# Patient Record
Sex: Male | Born: 1955
Health system: Southern US, Community
[De-identification: ages and names within clinical notes are randomized; demographics above are authoritative.]

## PROBLEM LIST (undated history)

## (undated) DIAGNOSIS — I498 Other specified cardiac arrhythmias: Secondary | ICD-10-CM

## (undated) DIAGNOSIS — I480 Paroxysmal atrial fibrillation: Secondary | ICD-10-CM

## (undated) DIAGNOSIS — I2510 Atherosclerotic heart disease of native coronary artery without angina pectoris: Secondary | ICD-10-CM

## (undated) DIAGNOSIS — E785 Hyperlipidemia, unspecified: Secondary | ICD-10-CM

## (undated) HISTORY — PX: TONSILLECTOMY: SUR1361

## (undated) HISTORY — DX: Atherosclerotic heart disease of native coronary artery without angina pectoris: I25.10

## (undated) HISTORY — DX: Hyperlipidemia, unspecified: E78.5

## (undated) HISTORY — DX: Other specified cardiac arrhythmias: I49.8

## (undated) HISTORY — DX: Paroxysmal atrial fibrillation: I48.0

## (undated) HISTORY — PX: HERNIA REPAIR: SHX51

---

## 2004-06-10 ENCOUNTER — Ambulatory Visit: Payer: Self-pay | Admitting: Cardiology

## 2005-06-25 ENCOUNTER — Ambulatory Visit (HOSPITAL_BASED_OUTPATIENT_CLINIC_OR_DEPARTMENT_OTHER): Admission: RE | Admit: 2005-06-25 | Discharge: 2005-06-25 | Payer: Self-pay | Admitting: Orthopedic Surgery

## 2005-06-25 ENCOUNTER — Ambulatory Visit (HOSPITAL_COMMUNITY): Admission: RE | Admit: 2005-06-25 | Discharge: 2005-06-25 | Payer: Self-pay | Admitting: Orthopedic Surgery

## 2006-03-08 ENCOUNTER — Ambulatory Visit: Payer: Self-pay | Admitting: Cardiology

## 2007-05-12 ENCOUNTER — Ambulatory Visit: Payer: Self-pay | Admitting: Cardiology

## 2007-06-23 ENCOUNTER — Ambulatory Visit: Payer: Self-pay | Admitting: Cardiology

## 2007-06-23 LAB — CONVERTED CEMR LAB
ALT: 27 units/L (ref 0–53)
Bilirubin, Direct: 0.1 mg/dL (ref 0.0–0.3)
Cholesterol: 131 mg/dL (ref 0–200)
HDL: 36.5 mg/dL — ABNORMAL LOW (ref >39.0)
LDL Cholesterol: 86 mg/dL (ref 0–99)
Total CHOL/HDL Ratio: 3.6
Total Protein: 6.6 g/dL (ref 6.0–8.3)
Triglycerides: 45 mg/dL (ref 0–149)
VLDL: 9 mg/dL (ref 0–40)

## 2007-07-28 ENCOUNTER — Encounter: Payer: Self-pay | Admitting: Cardiology

## 2007-07-28 ENCOUNTER — Ambulatory Visit: Payer: Self-pay

## 2007-11-02 ENCOUNTER — Ambulatory Visit: Payer: Self-pay | Admitting: Cardiology

## 2007-11-10 ENCOUNTER — Ambulatory Visit: Payer: Self-pay | Admitting: Cardiology

## 2007-11-10 LAB — CONVERTED CEMR LAB
BUN: 13 mg/dL
Basophils Absolute: 0 10*3/uL
Basophils Relative: 0.1 %
CO2: 32 meq/L
Calcium: 8.9 mg/dL
Chloride: 107 meq/L
Creatinine, Ser: 0.9 mg/dL
Eosinophils Absolute: 0.1 10*3/uL
Eosinophils Relative: 1.9 %
GFR calc Af Amer: 114 mL/min
GFR calc non Af Amer: 95 mL/min
Glucose, Bld: 96 mg/dL
HCT: 40 %
Hemoglobin: 13.6 g/dL
INR: 1
Lymphocytes Relative: 27.1 %
MCHC: 34.1 g/dL
MCV: 87.7 fL
Monocytes Absolute: 0.4 10*3/uL
Monocytes Relative: 5.7 %
Neutro Abs: 4.2 10*3/uL
Neutrophils Relative %: 65.2 %
Platelets: 184 10*3/uL
Potassium: 4.1 meq/L
Prothrombin Time: 12.1 s
RBC: 4.56 M/uL
RDW: 12.5 %
Sodium: 142 meq/L
WBC: 6.5 10*3/uL
aPTT: 30.7 s — ABNORMAL HIGH

## 2007-11-16 ENCOUNTER — Inpatient Hospital Stay (HOSPITAL_BASED_OUTPATIENT_CLINIC_OR_DEPARTMENT_OTHER): Admission: RE | Admit: 2007-11-16 | Discharge: 2007-11-17 | Payer: Self-pay | Admitting: Cardiology

## 2007-11-16 ENCOUNTER — Ambulatory Visit: Payer: Self-pay | Admitting: Cardiology

## 2007-11-30 ENCOUNTER — Ambulatory Visit: Payer: Self-pay | Admitting: Cardiovascular Disease

## 2007-12-12 ENCOUNTER — Ambulatory Visit (HOSPITAL_COMMUNITY): Admission: RE | Admit: 2007-12-12 | Discharge: 2007-12-12 | Payer: Self-pay | Admitting: Cardiology

## 2008-01-26 ENCOUNTER — Ambulatory Visit: Payer: Self-pay | Admitting: Cardiology

## 2008-01-26 LAB — CONVERTED CEMR LAB
ALT: 18 units/L (ref 0–53)
AST: 18 units/L (ref 0–37)
Albumin: 4.4 g/dL (ref 3.5–5.2)
HDL: 39.7 mg/dL (ref >39.0)
Total CHOL/HDL Ratio: 3.9
Triglycerides: 51 mg/dL (ref 0–149)
VLDL: 10 mg/dL (ref 0–40)

## 2008-11-26 ENCOUNTER — Encounter (INDEPENDENT_AMBULATORY_CARE_PROVIDER_SITE_OTHER): Payer: Self-pay | Admitting: *Deleted

## 2009-02-05 ENCOUNTER — Ambulatory Visit: Payer: Self-pay | Admitting: *Deleted

## 2009-02-06 IMAGING — CR DG CHEST W/ LORDOTIC
3 series · 3 of 3 positions shown · non-contrast
Comparison: None

CLINICAL DATA: Rule out lung nodule.  New onset of shortness of
breath with exercise.

CHEST - 1 VIEW WITH LORDOTIC VIEW(S)

[w chest pa]
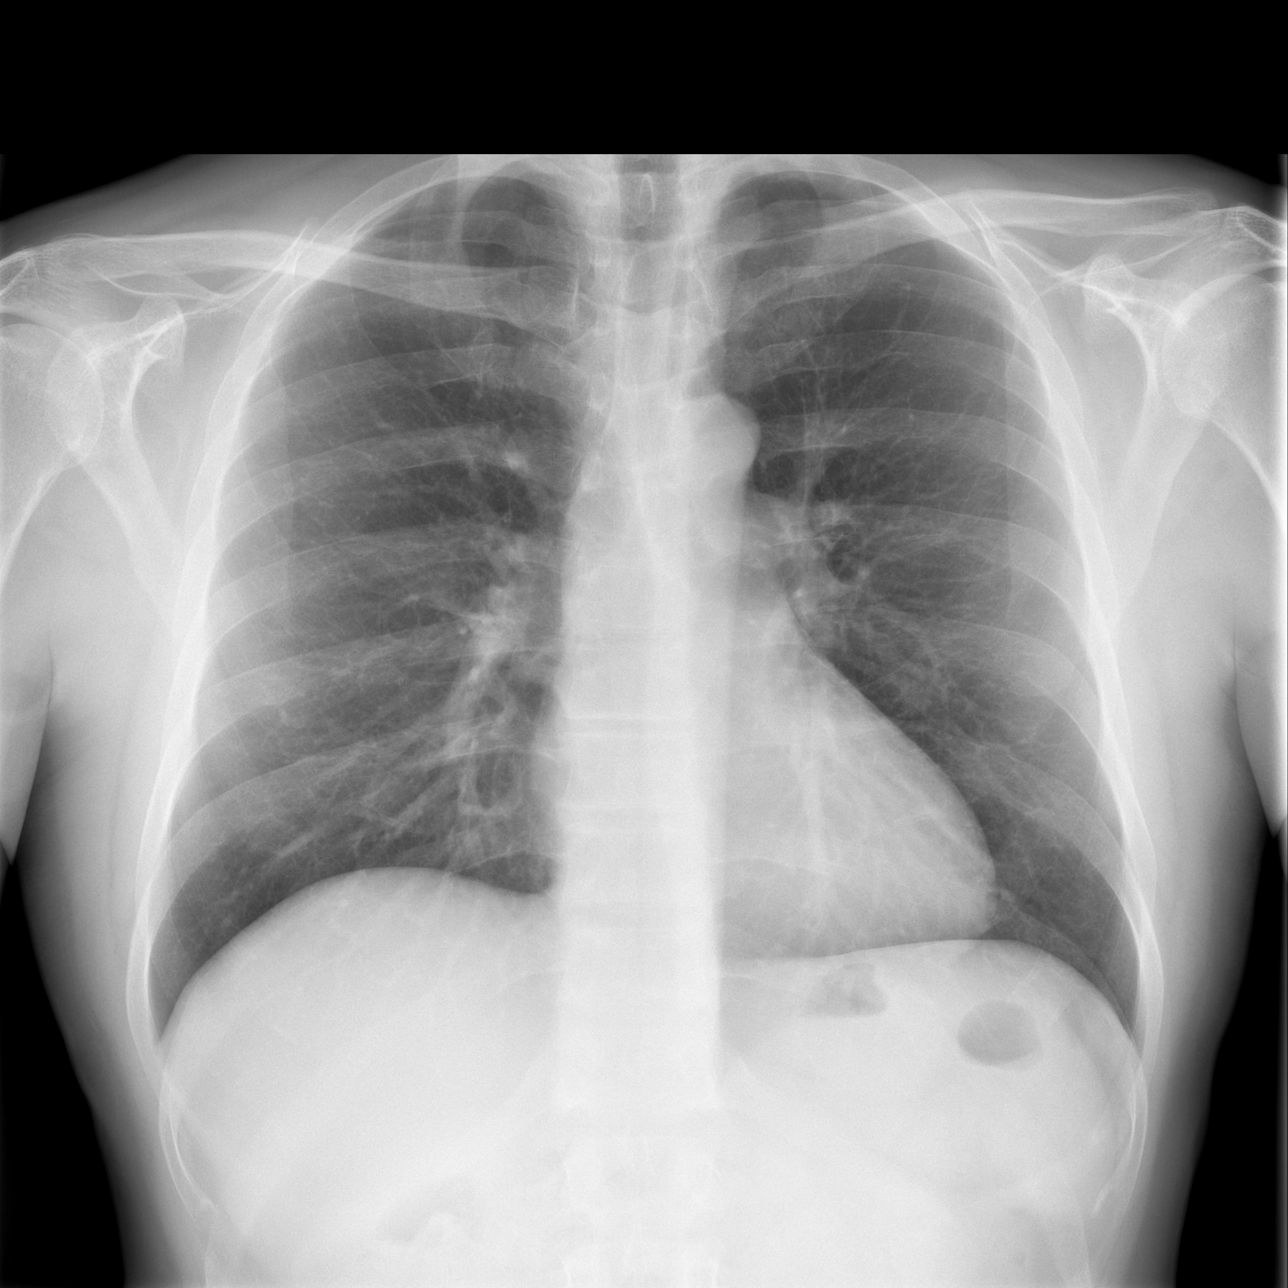

[w chest lat]
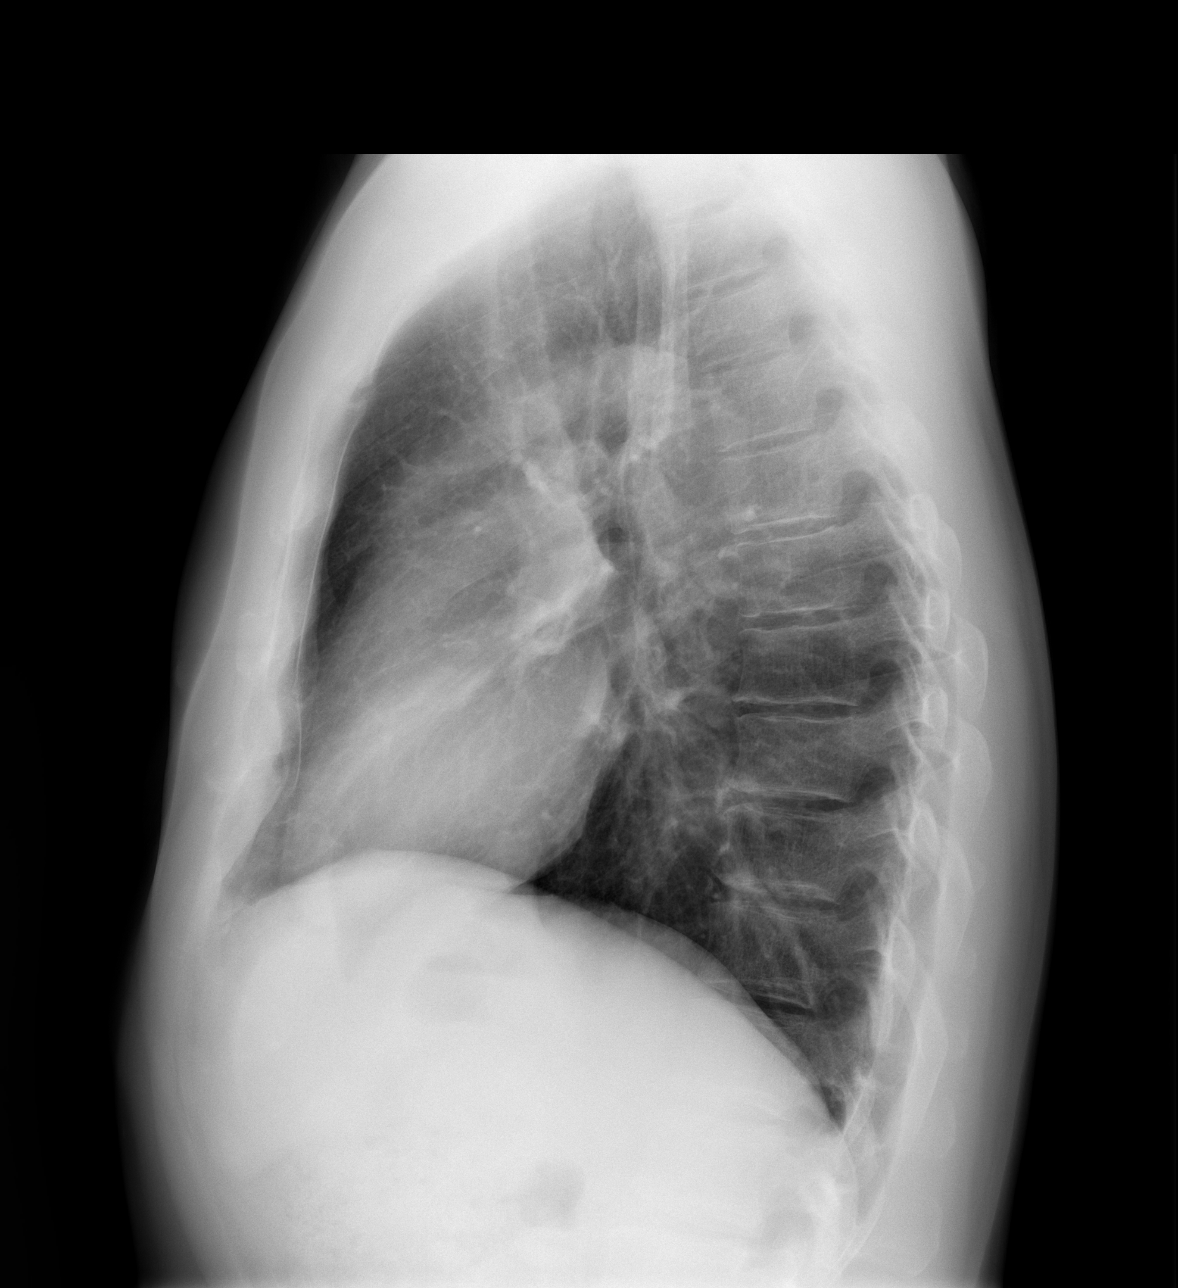

[w chest ap]
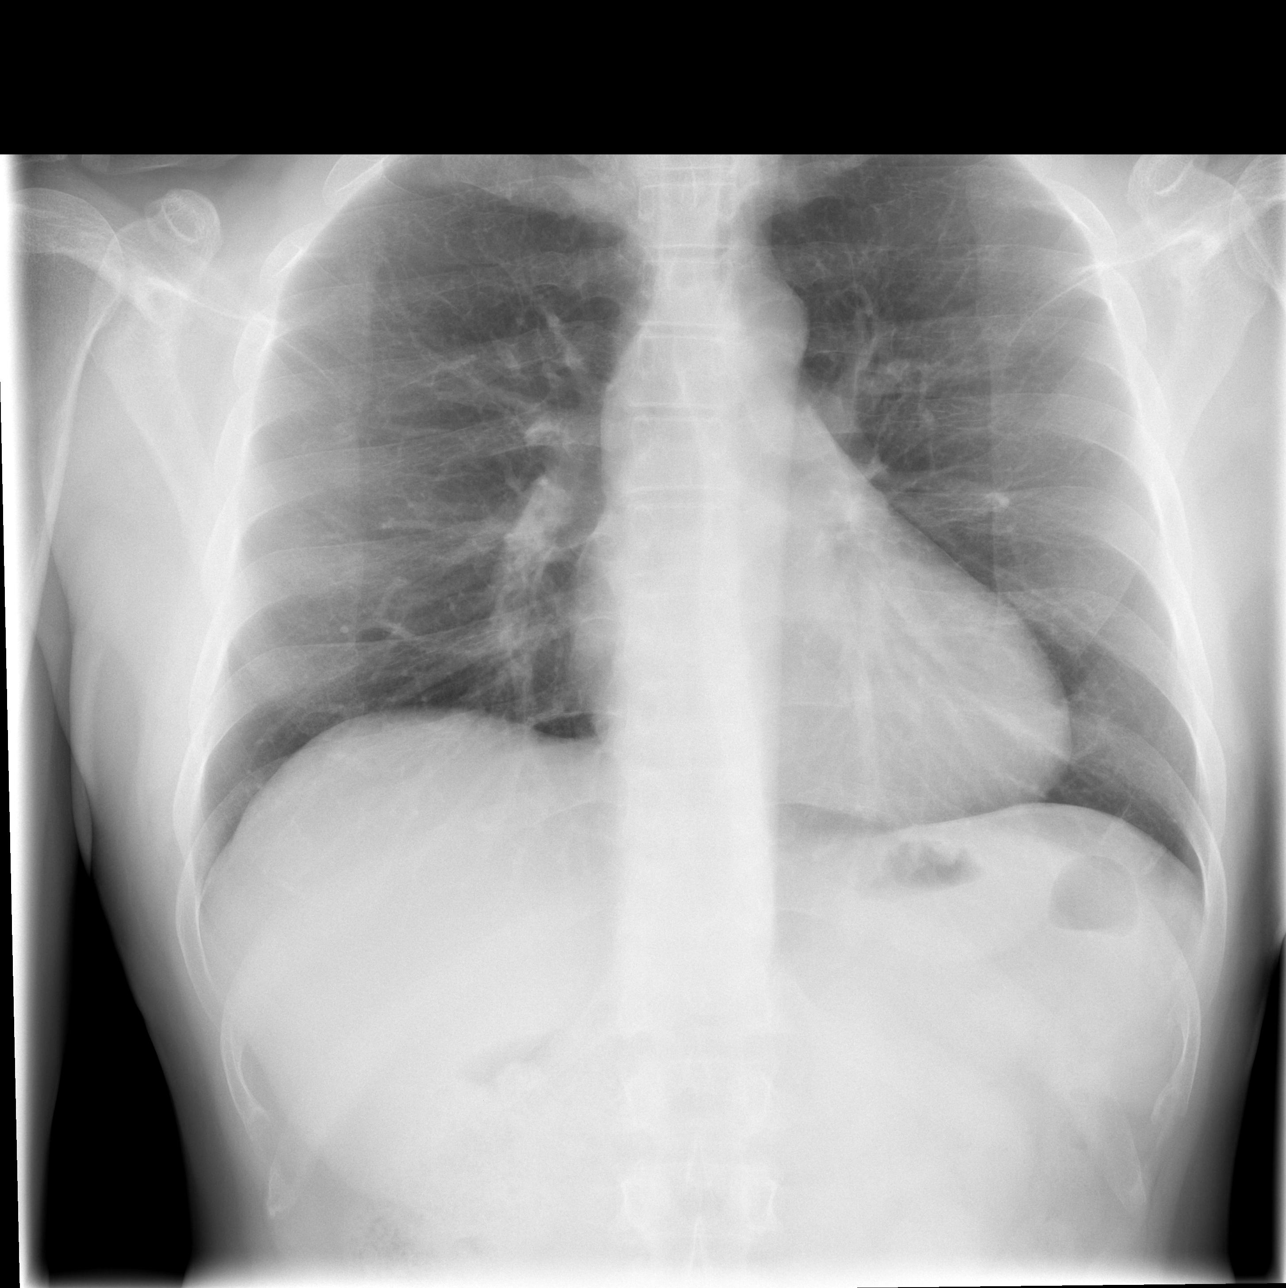

[3 of 3 positions shown; findings below may reference images not displayed]

FINDINGS: Cardiomediastinal silhouette is within normal limits.
Lungs are free of focal consolidations and pleural effusions.
Apical lordotic view demonstrates no definite apical mass.
IMPRESSION: No evidence for acute cardiopulmonary disease.

## 2009-07-18 ENCOUNTER — Encounter: Payer: Self-pay | Admitting: Cardiology

## 2009-08-14 DIAGNOSIS — I2510 Atherosclerotic heart disease of native coronary artery without angina pectoris: Secondary | ICD-10-CM | POA: Insufficient documentation

## 2009-08-14 DIAGNOSIS — R0602 Shortness of breath: Secondary | ICD-10-CM | POA: Insufficient documentation

## 2009-08-14 DIAGNOSIS — E785 Hyperlipidemia, unspecified: Secondary | ICD-10-CM | POA: Insufficient documentation

## 2009-08-14 DIAGNOSIS — R42 Dizziness and giddiness: Secondary | ICD-10-CM | POA: Insufficient documentation

## 2009-08-15 ENCOUNTER — Ambulatory Visit: Payer: Self-pay | Admitting: Cardiology

## 2009-08-15 DIAGNOSIS — I498 Other specified cardiac arrhythmias: Secondary | ICD-10-CM | POA: Insufficient documentation

## 2009-09-04 ENCOUNTER — Ambulatory Visit: Payer: Self-pay | Admitting: Cardiology

## 2009-09-04 ENCOUNTER — Encounter (INDEPENDENT_AMBULATORY_CARE_PROVIDER_SITE_OTHER): Payer: Self-pay | Admitting: *Deleted

## 2009-09-04 LAB — CONVERTED CEMR LAB
ALT: 32 units/L (ref 0–53)
Alkaline Phosphatase: 45 units/L (ref 39–117)
Bilirubin, Direct: 0.2 mg/dL (ref 0.0–0.3)
Cholesterol: 119 mg/dL (ref 0–200)
Total Protein: 7 g/dL (ref 6.0–8.3)

## 2010-01-06 ENCOUNTER — Ambulatory Visit (HOSPITAL_COMMUNITY): Admission: RE | Admit: 2010-01-06 | Discharge: 2010-01-06 | Payer: Self-pay | Admitting: Cardiology

## 2010-01-06 ENCOUNTER — Ambulatory Visit: Payer: Self-pay

## 2010-01-06 ENCOUNTER — Ambulatory Visit: Payer: Self-pay | Admitting: Cardiology

## 2010-08-12 NOTE — Letter (Signed)
Summary: [facility], Main Office  [address]   [place], [place] [address]   Phone: [phone]  Fax: [phone]     September 04, 2009 MRN: [id number]   [facility] [address] [place], [place]  [address]   Dear [name] [name],  We have reviewed your cholesterol results.  They are as follows:     Total Cholesterol:    119 (Desirable: less than 200)       HDL  Cholesterol:     52.20  (Desirable: greater than 40 for men and 50 for women)       LDL Cholesterol:       57  (Desirable: less than 100 for low risk and less than 70 for moderate to high risk)       Triglycerides:       48.0  (Desirable: less than 150)  Our recommendations include:These numbers look good. Continue on the same medicine. Liver function is normal. Take care, Dr. Darel Hong.    Call our office at the number listed above if you have any questions.  Lowering your LDL cholesterol is important, but it is only one of a large number of "risk factors" that may indicate that you are at risk for heart disease, stroke or other complications of hardening of the arteries.  Other risk factors include:   A.  Cigarette Smoking* B.  High Blood Pressure* C.  Obesity* D.   Low HDL Cholesterol (see yours above)* E.   Diabetes Mellitus (higher risk if your is uncontrolled) F.  Family history of premature heart disease G.  Previous history of stroke or cardiovascular disease    *These are risk factors YOU HAVE CONTROL OVER.  For more information, visit .  There is now evidence that lowering the TOTAL CHOLESTEROL AND LDL CHOLESTEROL can reduce the risk of heart disease.  The American Heart Association recommends the following guidelines for the treatment of elevated cholesterol:  1.  If there is now current heart disease and less than two risk factors, TOTAL CHOLESTEROL should be less than 200 and LDL CHOLESTEROL should be less than 100. 2.  If there is current heart  disease or two or more risk factors, TOTAL CHOLESTEROL should be less than 200 and LDL CHOLESTEROL should be less than 70.  A diet low in cholesterol, saturated fat, and calories is the cornerstone of treatment for elevated cholesterol.  Cessation of smoking and exercise are also important in the management of elevated cholesterol and preventing vascular disease.  Studies have shown that 30 to 60 minutes of physical activity most days can help lower blood pressure, lower cholesterol, and keep your weight at a healthy level.  Drug therapy is used when cholesterol levels do not respond to therapeutic lifestyle changes (smoking cessation, diet, and exercise) and remains unacceptably high.  If medication is started, it is important to have you levels checked periodically to evaluate the need for further treatment options.  Thank you,    [employer] Team

## 2010-08-12 NOTE — Miscellaneous (Signed)
Summary: refill Crestor 20 mg 30 x0 ,appt on 08-15-09  Clinical Lists Changes  Medications: Added new medication of CRESTOR 20 MG TABS (ROSUVASTATIN CALCIUM) once daily - Signed Rx of CRESTOR 20 MG TABS (ROSUVASTATIN CALCIUM) once daily;  #30 x 0;  Signed;  Entered by: Oswald Hillock;  Authorized by: Ferman Hamming, MD, [facility];  Method used: Faxed to [facility], [address], [place], [place]  [address], Ph: [phone], Fax: [phone]    Prescriptions: CRESTOR 20 MG TABS (ROSUVASTATIN CALCIUM) once daily  #30 x 0   Entered by:   Oswald Hillock   Authorized by:   Ferman Hamming, MD, [facility]   Signed by:   Oswald Hillock on 07/18/2009   Method used:   Faxed to ...       [facility] (retail)       [address]       [place], [place]  [address]       Ph: [phone]       Fax: [phone]   RxID:   [id number]

## 2010-08-12 NOTE — Assessment & Plan Note (Signed)
Summary: f1y per pt call/lg  Medications Added ASPIRIN 81 MG TBEC (ASPIRIN) Take one tablet by mouth daily FISH OIL   OIL (FISH OIL) tab by mouth once daily        History of Present Illness: [name] is a pleasant  gentleman who has a history of coronary artery disease.  He underwent cardiac catheterization on Nov 16, 2007, and that time he was found to have a 50-60% PDA and 30-40% lesions in the LAD and circumflex.  He had normal LV function.  We have been treating him with medical therapy.  I last saw him in July of 2009. Since I last saw him, there is no dyspnea, chest pain, palpitations or syncope.  There is no pedal edema.   Preventive Screening-Counseling & Management  Alcohol-Tobacco     Smoking Status: never  Current Medications (verified): 1)  Crestor 20 Mg Tabs (Rosuvastatin Calcium) .... Once Daily 2)  Aspirin 81 Mg Tbec (Aspirin) .... Take One Tablet By Mouth Daily 3)  Fish Oil   Oil (Fish Oil) .... Tab By Mouth Once Daily  Past History:  Past Medical History: Current Problems:  HYPERLIPIDEMIA (ICD-272.4) CAD (ICD-414.00)  Past Surgical History: hernia repair Tonsillectomy  Social History: Reviewed history and no changes required. Tobacco Use - No.  Alcohol Use - yes Smoking Status:  never  Review of Systems       no fevers or chills, productive cough, hemoptysis, dysphasia, odynophagia, melena, hematochezia, dysuria, hematuria, rash, seizure activity, orthopnea, PND, pedal edema, claudication. Remaining systems are negative.   Vital Signs:  Patient profile:   55 year old male Height:      73 inches Weight:      190 pounds BMI:     25.16 Pulse rate:   41 / minute Resp:     12 per minute BP sitting:   124 / 90  (left arm)  Vitals Entered By: Kem Parkinson (August 15, 2009 4:23 PM)  Physical Exam  General:  Well-developed well-nourished in no acute distress.  Skin is warm and dry.  HEENT is normal.  Neck is supple. No thyromegaly.    Chest is clear to auscultation with normal expansion.  Cardiovascular exam is bradycardic.  Abdominal exam nontender or distended. No masses palpated. Extremities show no edema. neuro grossly intact    EKG  Procedure date:  08/15/2009  Findings:      Marked sinus bradycardia at a rate of 41. First degree AV block. No ST changes.  Impression & Recommendations:  Problem # 1:  CAD (ICD-414.00) Continue aspirin and statin. His updated medication list for this problem includes:    Aspirin 81 Mg Tbec (Aspirin) .Marland Kitchen... Take one tablet by mouth daily  Problem # 2:  HYPERLIPIDEMIA (ICD-272.4) Continue statin. Check lipids and liver. His updated medication list for this problem includes:    Crestor 20 Mg Tabs (Rosuvastatin calcium) ..... Once daily  Problem # 3:  BRADYCARDIA (ICD-427.89) Patient has a heart rate of 41. He is having no symptoms. I will schedule an echocardiogram to reassess LV function and an exercise treadmill to make sure that his heart rate improves with exercise. His updated medication list for this problem includes:    Aspirin 81 Mg Tbec (Aspirin) .Marland Kitchen... Take one tablet by mouth daily  Orders: Treadmill (Treadmill)  Other Orders: Echocardiogram (Echo)  Patient Instructions: 1)  Your physician recommends that you schedule a follow-up appointment in: ONE YEAR 2)  Your physician recommends that you return for  lab work ZO:XWRU TREADMILL-LIPID/LIVER-272.0/V58.69 3)  Your physician has requested that you have an echocardiogram.  Echocardiography is a painless test that uses sound waves to create images of your heart. It provides your doctor with information about the size and shape of your heart and how well your heart's chambers and valves are working.  This procedure takes approximately one hour. There are no restrictions for this procedure. 4)  Your physician has requested that you have an exercise tolerance test.  For further information please visit  [email or web address]  Please also follow instruction sheet, as given.

## 2010-09-30 ENCOUNTER — Encounter: Payer: Self-pay | Admitting: Cardiology

## 2010-10-07 ENCOUNTER — Telehealth: Payer: Self-pay | Admitting: Cardiology

## 2010-10-07 NOTE — Telephone Encounter (Signed)
Okay for labs prior to appt. Left message for pt to call to schedule

## 2010-10-07 NOTE — Telephone Encounter (Signed)
Pt would like to have blood work done before his appt in april

## 2010-10-10 ENCOUNTER — Ambulatory Visit: Payer: Self-pay | Admitting: Cardiology

## 2010-10-30 ENCOUNTER — Other Ambulatory Visit: Payer: BC Managed Care – PPO | Admitting: *Deleted

## 2010-11-03 ENCOUNTER — Encounter: Payer: Self-pay | Admitting: Cardiology

## 2010-11-04 ENCOUNTER — Ambulatory Visit (INDEPENDENT_AMBULATORY_CARE_PROVIDER_SITE_OTHER): Payer: BC Managed Care – PPO | Admitting: Cardiology

## 2010-11-04 ENCOUNTER — Other Ambulatory Visit: Payer: BC Managed Care – PPO | Admitting: *Deleted

## 2010-11-04 ENCOUNTER — Encounter: Payer: Self-pay | Admitting: Cardiology

## 2010-11-04 DIAGNOSIS — E785 Hyperlipidemia, unspecified: Secondary | ICD-10-CM

## 2010-11-04 DIAGNOSIS — I2510 Atherosclerotic heart disease of native coronary artery without angina pectoris: Secondary | ICD-10-CM

## 2010-11-04 MED ORDER — ATORVASTATIN CALCIUM 40 MG PO TABS: 40.0000 mg | ORAL_TABLET | Freq: Every day | ORAL | Status: DC

## 2010-11-04 NOTE — Patient Instructions (Signed)
Your physician recommends that you schedule a follow-up appointment in: 1 year with Dr. Jens Som  Fasting blood work in 1 month.  Your physician has recommended you make the following change in your medication: STOP CRESTOR AND START ATORVASTATIN 40 mg daily.

## 2010-11-04 NOTE — Assessment & Plan Note (Signed)
Continue aspirin and statin. 

## 2010-11-04 NOTE — Assessment & Plan Note (Signed)
Discontinue Crestor and begin Lipitor 40 mg daily. Check lipids and liver in 4 weeks.

## 2010-11-04 NOTE — Assessment & Plan Note (Signed)
Previous exercise treadmill demonstrated chronotropic competence.

## 2010-11-04 NOTE — Progress Notes (Signed)
HPI:[name] is a pleasant  gentleman who has a history of coronary artery disease.  He underwent cardiac catheterization on Nov 16, 2007, and that time he was found to have a 50-60% PDA and 30-40% lesions in the LAD and circumflex.  He had normal LV function.  We have been treating him with medical therapy. ETT in June of 2011 was negative adequate. I last saw him in Feb 2011. Since I last saw him, there is no dyspnea, chest pain, palpitations or syncope.  There is no pedal edema.  Current Outpatient Prescriptions  Medication Sig Dispense Refill  . aspirin 81 MG tablet Take 81 mg by mouth daily.        . Omega-3 Fatty Acids (FISH OIL) 1000 MG CAPS Take 1 capsule by mouth daily.        . rosuvastatin (CRESTOR) 20 MG tablet Take 20 mg by mouth daily.           Past Medical History  Diagnosis Date  . Hyperlipidemia   . Coronary artery disease     Past Surgical History  Procedure Date  . Hernia repair   . Tonsillectomy     History   Social History  . Marital Status: Married    Spouse Name: N/A    Number of Children: N/A  . Years of Education: N/A   Occupational History  . Not on file.   Social History Main Topics  . Smoking status: Never Smoker   . Smokeless tobacco: Not on file  . Alcohol Use: Yes  . Drug Use: Not on file  . Sexually Active: Not on file   Other Topics Concern  . Not on file   Social History Narrative  . No narrative on file    ROS: no fevers or chills, productive cough, hemoptysis, dysphasia, odynophagia, melena, hematochezia, dysuria, hematuria, rash, seizure activity, orthopnea, PND, pedal edema, claudication. Remaining systems are negative.  Physical Exam: Well-developed well-nourished in no acute distress.  Skin is warm and dry.  HEENT is normal.  Neck is supple. No thyromegaly.  Chest is clear to auscultation with normal expansion.  Cardiovascular exam is regular rate and rhythm.  Abdominal exam nontender or distended. No masses  palpated. Extremities show no edema. neuro grossly intact  ECG Sinus Rhythm at a Rate of 51. No ST Changes.

## 2010-11-25 NOTE — Assessment & Plan Note (Signed)
[facility]                            CARDIOLOGY OFFICE NOTE   NAME:[name] [name]                    MRN:          [id number]  DATE:11/30/2007                            DOB:          01-20-1956    PRIMARY CARDIOLOGIST:  Madolyn Frieze. [name] Som, MD, [facility].   [name] [name] is a pleasant 55 year old married white male patient who  has a family history of coronary artery disease and had an abnormal CT  at [facility], suggesting calcium score of 265 with a 230 of  that in the LAD, ejection fraction 64%.  There was a 50% LAD, 20-40%  plaque distal LAD, 40% circumflex and calcium noted in the RCA.  Recent  stress echo was performed in January 2009.  He exercised for 13 minutes  and had no ischemia.  The patient was playing basketball and developed  some shortness of breath and dizziness without frank syncope.  He did  not have any chest pain but because of this, he underwent cardiac  catheterization by Dr. Riley [name] on and 409.  This revealed moderate  calcification in the LAD with 30-40% lesions, 30-40% mild luminal  irregularities in the proximal circumflex and 50-60 eccentric plaque at  the crux in the RCA.  He had normal LV function.  Aggressive risk factor  reduction was recommended.  The patient's statin was increased back in  December.  He is a nonsmoker.  He does not have diabetes or  hypertension.  He has greatly improved his diet and he does not smoke.  Since he has been home, he says he has a chronic sense of something not  right in his head, not quite dizziness, but feels he could become dizzy.  He denies any chest pain, palpitations, dizziness or presyncope.   CURRENT MEDICATIONS:  1. Aspirin 81 mg daily.  2. Fish oil daily.  3. Pravastatin 80 mg daily.   PHYSICAL EXAMINATION:  GENERAL APPEARANCE:  This is a pleasant 55-year-  old white male in no acute distress.  VITAL SIGNS:  Blood pressure 110/75, pulse 58, weight 188.  NECK:   Without JVD, HJR, bruit or thyroid enlargement.  LUNGS:  Clear anterior, posterior and lateral.  HEART:  Regular rate and rhythm at 60 beats per minute, normal S1 and  S2.  No murmur, rub, bruit, thrill or heave noted.  ABDOMEN:  Soft without organomegaly, masses, lesions or abnormal  tenderness.  RIGHT GROIN:  Without hematoma or hemorrhage.  EXTREMITIES:  Without cyanosis or edema.  He has good distal pulses.   IMPRESSION:  1. Dizziness, question etiology.  2. Nonobstructive coronary artery disease with 50-60% right coronary      artery and scattered 30-40% left anterior descending and circumflex      lesions.  Normal left ventricular function.  3. Low HDL, being treated.  4. Family history of coronary artery disease with a brother dying of      an myocardial infarction at an early age.   PLAN:  At this time, patient is stable from a cardiac standpoint.  We  will recheck his lipids and probably add Niaspan  and he will see Dr.  Jens [name] back in two months.  I told him he could go ahead and play  basketball and if he has any further symptoms, to call us.      [name] Reedy, PA-C  Electronically Signed      Madolyn Frieze. [name] Som, MD, [facility]  Electronically Signed   ML/MedQ  DD: 11/30/2007  DT: 11/30/2007  Job #: [id number]

## 2010-11-25 NOTE — Cardiovascular Report (Signed)
[name], [name]           ACCOUNT NO.:  [id number]   MEDICAL RECORD NO.:  [id number]          PATIENT TYPE:  OIB   LOCATION:  1961                         FACILITY:  [facility]   PHYSICIAN:  [name] [name]. [name] Kill, MD, FACCDATE OF BIRTH:  [birth year]   DATE OF PROCEDURE:  DATE OF DISCHARGE:                            CARDIAC CATHETERIZATION   INDICATIONS:  The patient is a 55 year old who has had an abnormal CT  scan of the coronaries at [facility].  He has had some  recent mild symptoms.  The current study was done to assess coronary  anatomy.  Dr. Jens [name] explained the risks, benefits, and alternatives  to the patient in detail, and I also proceeded to follow with that prior  to the test in the outpatient laboratory.   PROCEDURE:  1. Left heart catheterization.  2. Selective coronary arteriography.  3. Selective left ventriculography.   DESCRIPTION OF PROCEDURE:  The patient was brought to the  catheterization laboratory and prepped and draped in the usual fashion.  Through an anterior puncture, the right femoral artery was easily  entered.  A 4-French sheath was placed.  Views of the left and right  coronaries were obtained in multiple angiographic projections.  Central  aortic and left ventricular pressures were measured with a pigtail.  Ventriculography was then performed in the RAO projection.  The patient  tolerated the procedure well, and the sheath was removed with manual  compression.  He was taken to the holding area in satisfactory clinical  condition.  I showed the films to the patient on the table, and  subsequently repeated the process with his wife and daughter in the  holding area with the patient's permission.  There were no  complications.   HEMODYNAMIC DATA:  1. Central aortic pressure 114/70.  2. Left ventricular pressure 112/70.  3. There was no gradient pullback across the aortic valve.   ANGIOGRAPHIC DATA:  1. Ventriculography was done in  the RAO projection.  Overall, systolic      function appeared to be preserved.  No segmental abnormalities or      contraction were identified.  2. On plain fluoroscopy, there was generalized calcification,      predominantly in the proximal and mid left anterior descending      artery.  3. The left main was free of critical disease.  4. The left anterior descending artery coursed to the apex.  It was a      smaller caliber vessel.  There was a large diagonal branch.  There      was about 30% segmental ectasia in the proximal vessel.  It did not      appear to be high grade.  The septal itself has about 30% narrowing      in the diagonal more distally and a bifurcation of the small and      larger branch has 30%-40% eccentric plaquing.  None of this appears      to be highly obstructive.  5. The circumflex has some mild luminal irregularity with about 30%-      40% proximal narrowing as well.  This is scattered irregularity.  6. The right coronary is a very large-caliber vessel.  The vessel has      some generalized ectasia down to the crux.  At the crux is about      50%-60% eccentric plaque leading into a very large caliber      posterior descending branch and perhaps slightly less than about      50% leading into the continuation branch or posterolateral system.      Based upon the size of the lumen, it does not appear to be highly      obstructive.   CONCLUSION:  1. Well-preserved left ventricular function.  2. Generalized ectasia with moderate calcification of the left      anterior descending artery.  3. A 50%-60% bifurcational stenosis of the crux and very large-caliber      vessel.  4. Scattered irregularities of the left anterior descending and      circumflex system as described above.   PLAN:  1. The patient will return to Dr. Olga [name].  2. Aggressive risk factor reduction will be recommended.  3. The RCA could potentially be intervened upon if it were causing       significant ischemia.  However, the residual lumen appears to be      probably adequate for flow.  Mean targets with lipid reduction      would be helpful.      [name] [name]. [name] Kill, MD, [facility]  Electronically Signed     TDS/MEDQ  D:  11/16/2007  T:  11/17/2007  Job:  [id number]   cc:   Madolyn Frieze. [name] Som, MD, [facility]  Dr. Royal Piedra

## 2010-11-25 NOTE — Assessment & Plan Note (Signed)
[facility]                            CARDIOLOGY OFFICE NOTE   NAME:Tabb, GARVIN                    MRN:          [id number]  DATE:05/12/2007                            DOB:          15-Oct-1955    Mr. Horsch is a pleasant gentleman who has a strong family history of  coronary disease and a history of hyperlipidemia. Previous CT scan of  his chest in August 2007 at [facility] showed a calcium  score of 265 with 230 of that in the LAD. His ejection fraction was 64%.  There was a 50% LAD and 20% to 40% plaques distally in the LAD. There  was a 40% stenosis in the circumflex and there was calcium noted in the  right coronary artery. Since I last saw him, he is doing well with no  dyspnea, chest pain, palpitations, or syncope. There is no pedal edema.  He discontinued his Zocor as it was causing problems with headaches and  muscle fatigue.   CURRENT MEDICATIONS:  Aspirin 81 mg daily.   PHYSICAL EXAMINATION:  VITAL SIGNS:  Blood pressure 122/80, pulse 50. He  weighs 191 pounds.  HEENT:  Normal.  NECK:  Supple with no bruits.  CHEST:  Clear.  CARDIOVASCULAR:  Regular rate and rhythm.  ABDOMEN:  Shows no tenderness.  EXTREMITIES:  No edema.   Electrocardiogram shows a sinus bradycardia at a rate of 50. The axis is  normal. There are no ST changes noted.   DIAGNOSES:  1. Coronary artery disease - Given his strong family history and      previous 50% LAD, we will plan to proceed with a stress Myoview for      risk stratification. If it is negative then we will continue with      medical therapy. He will continue on his aspirin. I have also      recommended that we try another statin to see if he will tolerate.      We will give him Pravachol 40 mg p.o. nightly, and if he tolerates      it, we will check lipids and liver in six weeks and adjust with a      goal LDL of less than 70. He will otherwise continue with risk      factor  modification including diet and exercise. Note that he does      not smoke.  2. History of mildly elevated LDL - As per #1, we will give him      Pravachol.     Madolyn Frieze Jens Som, MD, [facility]  Electronically Signed    BSC/MedQ  DD: 05/12/2007  DT: 05/13/2007  Job #: [id number]   cc:   Molly Maduro L. Foy [place], M.D.

## 2010-11-25 NOTE — Assessment & Plan Note (Signed)
[facility]                            CARDIOLOGY OFFICE NOTE   NAME:[name] [name]                    MRN:          [id number]  DATE:01/26/2008                            DOB:          Feb 21, 1956    [name] is a pleasant 55 year old gentleman who has a history of  coronary artery disease.  He underwent cardiac catheterization on Nov 16, 2007, and that time he was found to have a 50-60% PDA and 30-40% lesions  in the LAD and circumflex.  He had normal LV function.  We have been  treating him with medical therapy.  Since I last saw him, there is no  dyspnea, chest pain, palpitations or syncope.  There is no pedal edema.   MEDICATIONS:  1. Aspirin mg 81 daily.  2. Fish oil.  3. Pravachol 80 mg daily.   PHYSICAL EXAMINATION:  VITAL SIGNS:  Blood pressure of 110/80 and his  pulse is 47.  Weighs 179 pounds.  HEENT:  Normal.  NECK:  Supple.  CHEST:  Clear.  CARDIOVASCULAR:  Regular rate and rhythm.  ABDOMEN:  No tenderness.  EXTREMITIES:  No edema.   DIAGNOSES:  1. Coronary artery disease - [name] is having no symptoms.  He      will continue with his aspirin and Pravachol.  He also will      continue with diet and exercise.  He does not smoke.  2. Family history of coronary artery disease.  3. Hyperlipidemia - He will continue on his statin.  We will check      lipids and liver today, and we will add Niaspan if his HDL remains      low.   I will see back in 12 months.     [name] [name] [name] Som, MD, [facility]  Electronically Signed    BSC/MedQ  DD: 01/26/2008  DT: 01/26/2008  Job #: [id number]

## 2010-11-25 NOTE — Procedures (Signed)
DUPLEX DEEP VENOUS EXAM - LOWER EXTREMITY   INDICATION:  Right lower extremity swelling and pain.   HISTORY:  Edema:  No  Trauma/Surgery:  The patient had muscle pulled over a week when playing  golf  Pain:  Yes  PE:  No  Previous DVT:  No  Anticoagulants:  No  Other:   DUPLEX EXAM:                CFV   SFV   PopV  PTV    GSV                R  L  R  L  R  L  R   L  R  L  Thrombosis    o  o  o     o     o      o  Spontaneous   +  +  +     +     +      +  Phasic        +  +  +     +     +      +  Augmentation  +  +  +     +     +      +  Compressible  +  +  +     +     +      +  Competent     +  +  +     +     +      +   Legend:  + - yes  o - no  p - partial  D - decreased   IMPRESSION:  No evidence of deep or superficial vein thrombosis noted in  right lower extremity.    _____________________________  P. Liliane Bade, M.D.   AC/MEDQ  D:  02/05/2009  T:  02/06/2009  Job:  [id number]

## 2010-11-25 NOTE — Assessment & Plan Note (Signed)
[facility]                            CARDIOLOGY OFFICE NOTE   NAME:[name] [name], [name] [name]                    MRN:          [id number]  DATE:11/02/2007                            DOB:          March 02, 1956    HISTORY OF PRESENT ILLNESS:  Mr. Farney is a 55 year old gentleman  that I have seen the past for coronary disease and mild hyperlipidemia.  He also has a strong family history of coronary disease as his brother  died of myocardial infarction at age 76, and his father had bypass at  age 54.  He does have a history of a CT scan in August 2007, at [facility] that showed a calcium score of 265 with 230 of that in  the LAD.  His ejection fraction was 64%.  There was a 50% LAD and 20-40%  plaques distal in the LAD.  There is a 40% stenosis in the circumflex.  There was also calcium noted in the right coronary artery.  His most  recent stress test was a stress echocardiogram performed on July 28, 2007.  He exercised for 13 minutes.  There were no wall motion  abnormalities noted.  I last saw him on May 12, 2007.  Note, he  typically does not have dyspnea on exertion, orthopnea, PND, pedal  edema, palpitations, presyncope, syncope or exertional chest pain.  However, this past weekend, the patient was playing basketball full  court.  He does this routinely.  However, during the first game, he felt  more dyspneic than normal.  On the second game after two laps down the  court, he became more short of breath and had a presyncopal episode.  Note, there was no frank syncope.  He was not having chest pain,  palpitations or any other symptoms.  Since that time, he has felt not  right.  He does have more dyspnea on exertion than normal.   MEDICATIONS:  1. Aspirin 81 mg daily.  2. Fish oil.  3. Pravachol 80 mg p.o. b.i.d.   PHYSICAL EXAMINATION:  VITAL SIGNS:  Today, shows a blood pressure of  108/71 and his pulse is 41.  He weighs 190  pounds.  HEENT:  Normal.  NECK:  Supple with no bruits.  CHEST:  Clear.  CARDIOVASCULAR:  Reveals a bradycardic rate, but a regular rhythm.  ABDOMEN:  Shows no tenderness.  EXTREMITIES:  Show no edema.   His electrocardiogram shows a marked sinus bradycardia at a rate 45.  The axis is normal.  There are no ST changes noted.   DIAGNOSES:  1. New onset dyspnea - Mr. Babers had significant dyspnea on      exertion, as well as a presyncopal episode while playing      basketball.  He has never had this before and he has always been      active.  His activities were not unusual for him.  He does have a      history of coronary disease with a previous CT scan of his chest in      August 2007, as outlined above.  He had a recent stress test, and I      do not think that is worth repeating.  I feel given his strong      family history that we should be definitive.  We will proceed with      cardiac catheterization.  The risk and benefits have been discussed      and the patient agrees to proceed.  2. Coronary artery disease - he will continue on his aspirin and      statin.  3. History of hyperlipidemia - he will continue on his Pravachol 80 mg      p.o. daily.  He is due to have his lipids and liver checked, and we      will schedule those with his pre-catheterization laboratories.   We will see him back in approximately 4-6 weeks after his this  catheterization.     Madolyn Frieze Jens Som, MD, [facility]  Electronically Signed    BSC/MedQ  DD: 11/02/2007  DT: 11/02/2007  Job #: [id number]   cc:   Molly Maduro L. Foy [place], M.D.

## 2010-11-28 NOTE — Assessment & Plan Note (Signed)
[facility]                              CARDIOLOGY OFFICE NOTE   NAME:[name] [name]                    MRN:          [id number]  DATE:  03/08/2006                              DOB:    HISTORY OF PRESENT ILLNESS:  [name] [name] is a pleasant 55 year old  gentleman who has a history of hyperlipidemia and a strong family history of  coronary disease.  Please refer to my previous notes for details.  Since I  last saw him, there has been no chest pain, dyspnea, or syncope, and there  is no claudication.  He did have a cardiac CT on February 10, 2006 at [facility].  He was found to have a calcium score of 265 with about 230  of that score in the LAD.  Ejection fraction was normal at 64%.  He had a  50% stenosis in the LAD.  There was also 20-40% plaques distally in the LAD.  He had a 40% stenosis in the circumflex and mild calcium was noted in the  right coronary artery.   MEDICATIONS:  None.   PHYSICAL EXAMINATION:  VITAL SIGNS:  Blood pressure 112/78 and his pulse is  54.  He weighs 184 pounds.  NECK:  Supple with no bruits.  CHEST:  Clear.  CARDIOVASCULAR:  Reveals bradycardic rate, but a regular rhythm.  ABDOMEN:  Exam shows no pulsatile masses and no bruits.  EXTREMITIES:  Show no edema.   LABORATORY DATA:  Electrocardiogram shows a sinus rhythm at a rate of 54.  There were no ST changes noted.   DIAGNOSES:  1. Coronary artery disease.  2. History of mildly elevated LDL.   PLAN:  [name] [name] is asymptomatic.  However, his cardiac CT does show  nonobstructive coronary disease.  I therefore asked him to resume his  aspirin at 81 mg p.o. daily.  We will also add a Statin (we will begin with  Zocor 40 mg p.o. q.h.s.).  We will have him return for fasting lipids and  liver in six weeks.  Our goal LDL will be less than 70, given his documented  coronary disease.  We discussed risk factor modification.  His blood  pressure is  well-controlled on no  medications and he does not smoke.  I stressed the importance of exercise  and diet.  I will see him back in 12 months.                              Madolyn Frieze Jens Som, MD, [facility]   BSC/MedQ  DD:  03/08/2006 DT:  03/08/2006 Job #:  [id number]   cc:   Molly Maduro L. Foy [place], MD

## 2010-11-28 NOTE — Op Note (Signed)
NAMEANGELLO, [name] [name] NO.:  [id number]   MEDICAL RECORD NO.:  [id number]          PATIENT TYPE:  AMB   LOCATION:  DSC                          FACILITY:  [facility]   PHYSICIAN:  Loreta Ave, M.D. DATE OF BIRTH:  Sep 29, 1955   DATE OF PROCEDURE:  06/25/2005  DATE OF DISCHARGE:                                 OPERATIVE REPORT   PREOPERATIVE DIAGNOSIS:  Right knee medial meniscus tear.   POSTOPERATIVE DIAGNOSIS:  Right knee medial meniscus tear with medial plica.   PROCEDURE:  Right knee exam under anesthesia, arthroscopy, excision medial  plica, and partial medial meniscectomy.   SURGEON:  Loreta Ave, M.D.   ASSISTANT:  Genene Churn. Owens, P.A.-C.   ANESTHESIA:  Knee block with sedation.   SPECIMENS:  None.   CULTURES:  None.   COMPLICATIONS:  None.   DRESSINGS:  Soft compressive.   PROCEDURE:  The patient was brought to the operating room and placed on the  operating table in supine position.  After adequate anesthesia had been  obtained, the right knee was examined.  Full motion, good stability,  palpable medial plica, positive medial McMurray's.  Tourniquet and leg  holder applied, leg prepped and draped in the usual sterile fashion.  Three  portals were created, one superolateral and one each medial and lateral  parapatellar.  Inflow catheter introduced, knee distended, arthroscope  introduced, knee inspected.  Good patellofemoral tracking.  Large fibrotic  medial plica with synovitis extending a third of the way across the  patellofemoral joint, excised in its entirety including the hypertrophic  synovitis.  Cruciate ligaments intact.  Medial compartment marked complex  tearing of the posterior half of the medial meniscus oblique out to the edge  and then extending into posterior third.  Most of the posterior third  removed after the posterior margin.  Tapered into remaining meniscus  salvaging the anterior 2/3.  The remaining knee examined.  A  little fraying,  lateral tibial plateau debrided.  Lateral meniscus intact.  At completion  all recess examined to be sure all loose fragments were removed.  The  instruments and fluid were  removed.  The portals of the knee were injected with Marcaine.  The portals  were closed with 4-0 nylon.  Sterile compressive dressing applied.  Anesthesia reversed.  Brought to the recovery room.  Tolerated the surgery  well without complications.      Loreta Ave, M.D.  Electronically Signed     DFM/MEDQ  D:  06/25/2005  T:  06/26/2005  Job:  [id number]

## 2010-12-02 ENCOUNTER — Other Ambulatory Visit (INDEPENDENT_AMBULATORY_CARE_PROVIDER_SITE_OTHER): Payer: BC Managed Care – PPO | Admitting: *Deleted

## 2010-12-02 DIAGNOSIS — E785 Hyperlipidemia, unspecified: Secondary | ICD-10-CM

## 2010-12-03 LAB — LIPID PANEL: Total CHOL/HDL Ratio: 2

## 2010-12-03 LAB — HEPATIC FUNCTION PANEL
AST: 21 U/L (ref 0–37)
Alkaline Phosphatase: 39 U/L (ref 39–117)
Bilirubin, Direct: 0.2 mg/dL (ref 0.0–0.3)
Total Bilirubin: 1.4 mg/dL — ABNORMAL HIGH (ref 0.3–1.2)

## 2011-11-23 ENCOUNTER — Telehealth: Payer: Self-pay | Admitting: Cardiology

## 2011-11-23 MED ORDER — ATORVASTATIN CALCIUM 40 MG PO TABS: 40.0000 mg | ORAL_TABLET | Freq: Every day | ORAL | Status: DC

## 2011-11-23 NOTE — Telephone Encounter (Signed)
New Problem:     Patient wanted to know if he needed a renewal of his atorvastatin (LIPITOR) 40 MG tablet.  Please call back.

## 2012-01-08 ENCOUNTER — Encounter: Payer: Self-pay | Admitting: *Deleted

## 2012-01-11 ENCOUNTER — Ambulatory Visit: Payer: BC Managed Care – PPO | Admitting: Cardiology

## 2012-01-18 ENCOUNTER — Encounter: Payer: Self-pay | Admitting: Cardiology

## 2012-01-18 ENCOUNTER — Encounter: Payer: Self-pay | Admitting: *Deleted

## 2012-01-18 ENCOUNTER — Ambulatory Visit (INDEPENDENT_AMBULATORY_CARE_PROVIDER_SITE_OTHER): Payer: BC Managed Care – PPO | Admitting: Cardiology

## 2012-01-18 VITALS — BP 123/71 | HR 41 | Ht 73.0 in | Wt 185.0 lb

## 2012-01-18 DIAGNOSIS — E785 Hyperlipidemia, unspecified: Secondary | ICD-10-CM

## 2012-01-18 DIAGNOSIS — I498 Other specified cardiac arrhythmias: Secondary | ICD-10-CM

## 2012-01-18 DIAGNOSIS — I2510 Atherosclerotic heart disease of native coronary artery without angina pectoris: Secondary | ICD-10-CM

## 2012-01-18 LAB — LIPID PANEL
Cholesterol: 107 mg/dL (ref 0–200)
LDL Cholesterol: 54 mg/dL (ref 0–99)

## 2012-01-18 LAB — HEPATIC FUNCTION PANEL
ALT: 27 U/L (ref 0–53)
AST: 24 U/L (ref 0–37)
Albumin: 4.1 g/dL (ref 3.5–5.2)
Total Bilirubin: 1.6 mg/dL — ABNORMAL HIGH (ref 0.3–1.2)

## 2012-01-18 NOTE — Assessment & Plan Note (Signed)
Continue aspirin and statin. 

## 2012-01-18 NOTE — Assessment & Plan Note (Signed)
Continue statin. Check lipids and liver. 

## 2012-01-18 NOTE — Patient Instructions (Addendum)
Your physician wants you to follow-up in: ONE YEAR WITH DR CRENSHAW You will receive a reminder letter in the mail two months in advance. If you don't receive a letter, please call our office to schedule the follow-up appointment.   Your physician recommends that you HAVE LAB WORK TODAY 

## 2012-01-18 NOTE — Assessment & Plan Note (Signed)
Patient is not having symptoms. Previous exercise treadmill demonstrated chronotropic competence.

## 2012-01-18 NOTE — Progress Notes (Signed)
   HPI: [name] [name] is a pleasant gentleman who has a history of coronary artery disease. He underwent cardiac catheterization on Nov 16, 2007, and that time he was found to have a 50-60% PDA and 30-40% lesions in the LAD and circumflex. He had normal LV function. We have been treating him with medical therapy. ETT in June of 2011 was negative adequate. I last saw him in August of 2012. Since I last saw him, there is no dyspnea, chest pain, palpitations or syncope. There is no pedal edema.   Current Outpatient Prescriptions  Medication Sig Dispense Refill  . aspirin 81 MG tablet Take 81 mg by mouth daily.        Marland Kitchen atorvastatin (LIPITOR) 40 MG tablet Take 1 tablet (40 mg total) by mouth daily.  30 tablet  12  . Omega-3 Fatty Acids (FISH OIL) 1000 MG CAPS Take 1 capsule by mouth daily.        Marland Kitchen DISCONTD: atorvastatin (LIPITOR) 40 MG tablet Take 1 tablet (40 mg total) by mouth daily.  30 tablet  11     Past Medical History  Diagnosis Date  . Hyperlipidemia   . Coronary artery disease   . BRADYCARDIA     Past Surgical History  Procedure Date  . Hernia repair   . Tonsillectomy     History   Social History  . Marital Status: Married    Spouse Name: N/A    Number of Children: N/A  . Years of Education: N/A   Occupational History  . Not on file.   Social History Main Topics  . Smoking status: Never Smoker   . Smokeless tobacco: Not on file  . Alcohol Use: Yes  . Drug Use: Not on file  . Sexually Active: Not on file   Other Topics Concern  . Not on file   Social History Narrative  . No narrative on file    ROS: no fevers or chills, productive cough, hemoptysis, dysphasia, odynophagia, melena, hematochezia, dysuria, hematuria, rash, seizure activity, orthopnea, PND, pedal edema, claudication. Remaining systems are negative.  Physical Exam: Well-developed well-nourished in no acute distress.  Skin is warm and dry.  HEENT is normal.  Neck is supple.  Chest is clear to  auscultation with normal expansion.  Cardiovascular exam is bradycardic but regular rhythm Abdominal exam nontender or distended. No masses palpated. Extremities show no edema. neuro grossly intact  ECG marked sinus bradycardia at a rate of 41. No ST changes.

## 2012-01-28 ENCOUNTER — Telehealth: Payer: Self-pay | Admitting: Cardiology

## 2012-01-28 NOTE — Telephone Encounter (Signed)
Will forward to Dr Crenshaw for review 

## 2012-01-28 NOTE — Telephone Encounter (Signed)
Ok for surgery [name] [name]  

## 2012-01-28 NOTE — Telephone Encounter (Signed)
Fax number [phone] surgical clearance for teeth extraction with iv sedation by dr Retta Mac , requewsted in June by dr Jens Som couldn't clear him until his appt 01-11-12, but they have still not received it, pls fax, call if any problem

## 2012-01-29 ENCOUNTER — Encounter: Payer: Self-pay | Admitting: *Deleted

## 2012-01-29 NOTE — Telephone Encounter (Signed)
SEE LETTERS  PER DR CRENSHAW PT MAY PROCEED  WITH TEETH EXTRACTIONS NOTE FAXED .[name] [name]

## 2012-02-01 ENCOUNTER — Telehealth: Payer: Self-pay | Admitting: Cardiology

## 2012-02-01 NOTE — Telephone Encounter (Signed)
Surgical clearance was not received pls re fax to [phone]

## 2012-02-01 NOTE — Telephone Encounter (Signed)
Surgical clearance letter was re-faxed to Dr. Heide Guile faxed # [phone] today, and verified that letter was received. I Left pt a message  to let him know.

## 2012-12-07 ENCOUNTER — Other Ambulatory Visit: Payer: Self-pay | Admitting: *Deleted

## 2012-12-07 MED ORDER — ATORVASTATIN CALCIUM 40 MG PO TABS: 40.0000 mg | ORAL_TABLET | Freq: Every day | ORAL | Status: DC

## 2013-01-18 ENCOUNTER — Encounter: Payer: Self-pay | Admitting: Cardiology

## 2013-02-02 ENCOUNTER — Ambulatory Visit: Payer: BC Managed Care – PPO | Admitting: Cardiology

## 2013-03-02 ENCOUNTER — Ambulatory Visit (INDEPENDENT_AMBULATORY_CARE_PROVIDER_SITE_OTHER): Payer: BC Managed Care – PPO | Admitting: Cardiology

## 2013-03-02 ENCOUNTER — Encounter: Payer: Self-pay | Admitting: Cardiology

## 2013-03-02 ENCOUNTER — Encounter: Payer: Self-pay | Admitting: *Deleted

## 2013-03-02 VITALS — BP 114/70 | HR 49 | Ht 74.0 in | Wt 185.0 lb

## 2013-03-02 DIAGNOSIS — I2510 Atherosclerotic heart disease of native coronary artery without angina pectoris: Secondary | ICD-10-CM

## 2013-03-02 LAB — HEMOGLOBIN A1C: Hgb A1c MFr Bld: 5.6 % (ref 4.6–6.5)

## 2013-03-02 LAB — LIPID PANEL
HDL: 45.9 mg/dL (ref >39.00)
Total CHOL/HDL Ratio: 3
Triglycerides: 53 mg/dL (ref 0.0–149.0)

## 2013-03-02 LAB — BASIC METABOLIC PANEL
CO2: 30 mEq/L (ref 19–32)
Calcium: 8.9 mg/dL (ref 8.4–10.5)
GFR: 91.21 mL/min (ref >60.00)
Sodium: 136 mEq/L (ref 135–145)

## 2013-03-02 LAB — HEPATIC FUNCTION PANEL
AST: 26 U/L (ref 0–37)
Albumin: 4 g/dL (ref 3.5–5.2)

## 2013-03-02 NOTE — Patient Instructions (Addendum)
Your physician wants you to follow-up in: ONE YEAR WITH DR CRENSHAW You will receive a reminder letter in the mail two months in advance. If you don't receive a letter, please call our office to schedule the follow-up appointment.   Your physician recommends that you HAVE LAB WORK TODAY 

## 2013-03-02 NOTE — Assessment & Plan Note (Signed)
No symptoms and previous functional study showed chronotropic competence.

## 2013-03-02 NOTE — Assessment & Plan Note (Signed)
Continue aspirin and statin. 

## 2013-03-02 NOTE — Assessment & Plan Note (Signed)
Continue statin. Check lipids and liver. He is also concerned about his glucose. Check potassium, renal function and hemoglobin A1c.

## 2013-03-02 NOTE — Progress Notes (Signed)
   HPI: [name] is a pleasant gentleman who has a history of coronary artery disease. He underwent cardiac catheterization on Nov 16, 2007, and that time he was found to have a 50-60% PDA and 30-40% lesions in the LAD and circumflex. He had normal LV function. We have been treating him with medical therapy. ETT in June of 2011 was negative adequate. I last saw him in July of 2013. Since I last saw him, there is no dyspnea, chest pain, palpitations or syncope. There is no pedal edema.   Current Outpatient Prescriptions  Medication Sig Dispense Refill  . aspirin 81 MG tablet Take 81 mg by mouth daily.        Marland Kitchen atorvastatin (LIPITOR) 40 MG tablet Take 1 tablet (40 mg total) by mouth daily.  30 tablet  12  . Omega-3 Fatty Acids (FISH OIL) 1000 MG CAPS Take 1 capsule by mouth daily.         No current facility-administered medications for this visit.     Past Medical History  Diagnosis Date  . Hyperlipidemia   . Coronary artery disease   . BRADYCARDIA     Past Surgical History  Procedure Laterality Date  . Hernia repair    . Tonsillectomy      History   Social History  . Marital Status: Married    Spouse Name: N/A    Number of Children: N/A  . Years of Education: N/A   Occupational History  . Not on file.   Social History Main Topics  . Smoking status: Never Smoker   . Smokeless tobacco: Not on file  . Alcohol Use: Yes  . Drug Use: Not on file  . Sexual Activity: Not on file   Other Topics Concern  . Not on file   Social History Narrative  . No narrative on file    ROS: no fevers or chills, productive cough, hemoptysis, dysphasia, odynophagia, melena, hematochezia, dysuria, hematuria, rash, seizure activity, orthopnea, PND, pedal edema, claudication. Remaining systems are negative.  Physical Exam: Well-developed well-nourished in no acute distress.  Skin is warm and dry.  HEENT is normal.  Neck is supple.  Chest is clear to auscultation with normal  expansion.  Cardiovascular exam is bradycardic but regular rhythm.  Abdominal exam nontender or distended. No masses palpated. Extremities show no edema. neuro grossly intact  ECG sinus bradycardia at a rate of 49. No ST changes.

## 2013-12-20 ENCOUNTER — Telehealth: Payer: Self-pay | Admitting: Cardiology

## 2013-12-20 NOTE — Telephone Encounter (Signed)
Received call from patient he stated he wanted to have fasting lab work when he sees Dr.Crenshaw 04/02/14.Patient advised to come fasting to appointment.Advised to come to [facility] for appointment.

## 2013-12-20 NOTE — Telephone Encounter (Signed)
Returned call to patient no answer.LMTC. 

## 2013-12-20 NOTE — Telephone Encounter (Signed)
New message          Pt would like to have fasting labs drawn at next visit in Sept

## 2014-01-05 ENCOUNTER — Other Ambulatory Visit: Payer: Self-pay

## 2014-01-05 MED ORDER — ATORVASTATIN CALCIUM 40 MG PO TABS: 40.0000 mg | ORAL_TABLET | Freq: Every day | ORAL | Status: DC

## 2014-04-02 ENCOUNTER — Ambulatory Visit (INDEPENDENT_AMBULATORY_CARE_PROVIDER_SITE_OTHER): Payer: BC Managed Care – PPO | Admitting: Cardiology

## 2014-04-02 ENCOUNTER — Encounter: Payer: Self-pay | Admitting: Cardiology

## 2014-04-02 VITALS — BP 100/60 | HR 63 | Ht 74.0 in | Wt 196.5 lb

## 2014-04-02 DIAGNOSIS — I2510 Atherosclerotic heart disease of native coronary artery without angina pectoris: Secondary | ICD-10-CM

## 2014-04-02 DIAGNOSIS — E785 Hyperlipidemia, unspecified: Secondary | ICD-10-CM

## 2014-04-02 NOTE — Progress Notes (Signed)
      HPI: Mr. Mcnatt is a pleasant gentleman who has a history of coronary artery disease. He underwent cardiac catheterization on Nov 16, 2007, and at that time he was found to have a 50-60% PDA and 30-40% lesions in the LAD and circumflex. He had normal LV function. We have been treating him with medical therapy. ETT in June of 2011 was negative adequate. Since I last saw him, the patient denies any dyspnea on exertion, orthopnea, PND, pedal edema, palpitations, syncope or chest pain.    Current Outpatient Prescriptions  Medication Sig Dispense Refill  . aspirin 81 MG tablet Take 81 mg by mouth daily.        Marland Kitchen atorvastatin (LIPITOR) 40 MG tablet Take 1 tablet (40 mg total) by mouth daily.  30 tablet  2  . Omega-3 Fatty Acids (FISH OIL) 1000 MG CAPS Take 1 capsule by mouth daily.         No current facility-administered medications for this visit.     Past Medical History  Diagnosis Date  . Hyperlipidemia   . Coronary artery disease   . BRADYCARDIA     Past Surgical History  Procedure Laterality Date  . Hernia repair    . Tonsillectomy      History   Social History  . Marital Status: Married    Spouse Name: N/A    Number of Children: N/A  . Years of Education: N/A   Occupational History  . Not on file.   Social History Main Topics  . Smoking status: Never Smoker   . Smokeless tobacco: Not on file  . Alcohol Use: Yes  . Drug Use: Not on file  . Sexual Activity: Not on file   Other Topics Concern  . Not on file   Social History Narrative  . No narrative on file    ROS: no fevers or chills, productive cough, hemoptysis, dysphasia, odynophagia, melena, hematochezia, dysuria, hematuria, rash, seizure activity, orthopnea, PND, pedal edema, claudication. Remaining systems are negative.  Physical Exam: Well-developed well-nourished in no acute distress.  Skin is warm and dry.  HEENT is normal.  Neck is supple.  Chest is clear to auscultation with normal  expansion.  Cardiovascular exam is regular rate and rhythm.  Abdominal exam nontender or distended. No masses palpated. Extremities show no edema. neuro grossly intact  ECG Sinus rhythm, left atrial enlargement

## 2014-04-02 NOTE — Patient Instructions (Signed)
Your physician wants you to follow-up in: ONE YEAR WITH DR CRENSHAW You will receive a reminder letter in the mail two months in advance. If you don't receive a letter, please call our office to schedule the follow-up appointment.   Your physician recommends that you HAVE LAB WORK TODAY 

## 2014-04-02 NOTE — Assessment & Plan Note (Signed)
Continue statin. Check lipids and liver. 

## 2014-04-02 NOTE — Assessment & Plan Note (Signed)
Continue ASA and statin  

## 2014-04-04 ENCOUNTER — Other Ambulatory Visit: Payer: Self-pay

## 2014-04-04 MED ORDER — ATORVASTATIN CALCIUM 40 MG PO TABS: 40.0000 mg | ORAL_TABLET | Freq: Every day | ORAL | Status: DC

## 2014-08-20 ENCOUNTER — Other Ambulatory Visit: Payer: Self-pay

## 2014-08-20 MED ORDER — ATORVASTATIN CALCIUM 40 MG PO TABS: 40.0000 mg | ORAL_TABLET | Freq: Every day | ORAL | Status: DC

## 2014-11-28 ENCOUNTER — Other Ambulatory Visit: Payer: Self-pay | Admitting: *Deleted

## 2014-11-28 MED ORDER — ATORVASTATIN CALCIUM 40 MG PO TABS: 40.0000 mg | ORAL_TABLET | Freq: Every day | ORAL | Status: DC

## 2015-05-10 ENCOUNTER — Other Ambulatory Visit: Payer: Self-pay | Admitting: *Deleted

## 2015-05-10 MED ORDER — ATORVASTATIN CALCIUM 40 MG PO TABS: 40.0000 mg | ORAL_TABLET | Freq: Every day | ORAL | Status: DC

## 2015-06-19 ENCOUNTER — Other Ambulatory Visit: Payer: Self-pay | Admitting: *Deleted

## 2015-06-19 ENCOUNTER — Other Ambulatory Visit: Payer: Self-pay | Admitting: Cardiology

## 2015-06-19 DIAGNOSIS — E785 Hyperlipidemia, unspecified: Secondary | ICD-10-CM

## 2015-06-19 MED ORDER — ATORVASTATIN CALCIUM 40 MG PO TABS
40.0000 mg | ORAL_TABLET | Freq: Every day | ORAL | Status: DC
Start: 2015-06-19 — End: 2015-07-22

## 2015-06-19 NOTE — Telephone Encounter (Signed)
Refilled for one month until upcoming appt.

## 2015-06-19 NOTE — Telephone Encounter (Signed)
°*  STAT* If patient is at the pharmacy, call can be transferred to refill team.   1. Which medications need to be refilled? (please list name of each medication and dose if known) Atorvastatin-pt has appt for January 6th-please call today  2. Which pharmacy/location (including street and city if local pharmacy) is medication to be sent to?[facility] [phone]  3. Do they need a 30 day or 90 day supply? 30

## 2015-07-17 NOTE — Progress Notes (Signed)
      HPI: FU coronary artery disease. He underwent cardiac catheterization on Nov 16, 2007, and at that time he was found to have a 50-60% PDA and 30-40% lesions in the LAD and circumflex. He had normal LV function. We have been treating him with medical therapy. ETT in June of 2011 was negative adequate. Since I last saw him, the patient denies any dyspnea on exertion, orthopnea, PND, pedal edema, palpitations, syncope or chest pain.   Current Outpatient Prescriptions  Medication Sig Dispense Refill  . aspirin 81 MG tablet Take 81 mg by mouth daily.      Marland Kitchen. atorvastatin (LIPITOR) 40 MG tablet Take 1 tablet (40 mg total) by mouth daily. 30 tablet 0  . Omega-3 Fatty Acids (FISH OIL) 1000 MG CAPS Take 1 capsule by mouth daily.       No current facility-administered medications for this visit.     Past Medical History  Diagnosis Date  . Hyperlipidemia   . Coronary artery disease   . BRADYCARDIA     Past Surgical History  Procedure Laterality Date  . Hernia repair    . Tonsillectomy      Social History   Social History  . Marital Status: Married    Spouse Name: N/A  . Number of Children: N/A  . Years of Education: N/A   Occupational History  . Not on file.   Social History Main Topics  . Smoking status: Never Smoker   . Smokeless tobacco: Not on file  . Alcohol Use: Yes  . Drug Use: Not on file  . Sexual Activity: Not on file   Other Topics Concern  . Not on file   Social History Narrative    ROS: no fevers or chills, productive cough, hemoptysis, dysphasia, odynophagia, melena, hematochezia, dysuria, hematuria, rash, seizure activity, orthopnea, PND, pedal edema, claudication. Remaining systems are negative.  Physical Exam: Well-developed well-nourished in no acute distress.  Skin is warm and dry.  HEENT is normal.  Neck is supple.  Chest is clear to auscultation with normal expansion.  Cardiovascular exam is regular rate and rhythm.  Abdominal exam  nontender or distended. No masses palpated. Extremities show no edema. neuro grossly intact  ECG Sinus rhythm at a rate of 64. RV conduction delay. No ST changes.

## 2015-07-19 ENCOUNTER — Encounter: Payer: Self-pay | Admitting: Cardiology

## 2015-07-19 ENCOUNTER — Ambulatory Visit (INDEPENDENT_AMBULATORY_CARE_PROVIDER_SITE_OTHER): Payer: BLUE CROSS/BLUE SHIELD | Admitting: Cardiology

## 2015-07-19 VITALS — BP 102/80 | HR 64 | Ht 73.0 in | Wt 195.3 lb

## 2015-07-19 DIAGNOSIS — I2510 Atherosclerotic heart disease of native coronary artery without angina pectoris: Secondary | ICD-10-CM

## 2015-07-19 NOTE — Assessment & Plan Note (Signed)
Continue aspirin and statin. Schedule exercise treadmill for risk stratification. 

## 2015-07-19 NOTE — Assessment & Plan Note (Signed)
Continue statin.Primary care follows his lipids and liver.

## 2015-07-19 NOTE — Patient Instructions (Signed)
Medication Instructions:   NO CHANGE  Testing/Procedures:  Your physician has requested that you have an exercise tolerance test. For further information please visit [email or web address]. Please also follow instruction sheet, as given.    Follow-Up:  Your physician wants you to follow-up in: ONE YEAR WITH DR CRENSHAW You will receive a reminder letter in the mail two months in advance. If you don't receive a letter, please call our office to schedule the follow-up appointment.   If you need a refill on your cardiac medications before your next appointment, please call your pharmacy.    Exercise Stress Electrocardiogram An exercise stress electrocardiogram is a test to check how blood flows to your heart. It is done to find areas of poor blood flow. You will need to walk on a treadmill for this test. The electrocardiogram will record your heartbeat when you are at rest and when you are exercising. BEFORE THE PROCEDURE  Do not have drinks with caffeine or foods with caffeine for 24 hours before the test, or as told by your doctor. This includes coffee, tea (even decaf tea), sodas, chocolate, and cocoa.  Follow your doctor's instructions about eating and drinking before the test.  Ask your doctor what medicines you should or should not take before the test. Take your medicines with water unless told by your doctor not to.  If you use an inhaler, bring it with you to the test.  Bring a snack to eat after the test.  Do not  smoke for 4 hours before the test.  Do not put lotions, powders, creams, or oils on your chest before the test.  Wear comfortable shoes and clothing. PROCEDURE  You will have patches put on your chest. Small areas of your chest may need to be shaved. Wires will be connected to the patches.  Your heart rate will be watched while you are resting and while you are exercising.  You will walk on the treadmill. The treadmill will slowly get faster to raise your heart  rate.  The test will take about 1-2 hours. AFTER THE PROCEDURE  Your heart rate and blood pressure will be watched after the test.  You may return to your normal diet, activities, and medicines or as told by your doctor.   This information is not intended to replace advice given to you by your health care provider. Make sure you discuss any questions you have with your health care provider.   Document Released: 12/16/2007 Document Revised: 07/20/2014 Document Reviewed: 03/06/2013 Elsevier Interactive Patient Education 2016 [employer]  

## 2015-07-22 ENCOUNTER — Other Ambulatory Visit: Payer: Self-pay | Admitting: *Deleted

## 2015-07-22 DIAGNOSIS — E785 Hyperlipidemia, unspecified: Secondary | ICD-10-CM

## 2015-07-22 MED ORDER — ATORVASTATIN CALCIUM 40 MG PO TABS: 40.0000 mg | ORAL_TABLET | Freq: Every day | ORAL | Status: DC

## 2015-08-01 ENCOUNTER — Encounter (HOSPITAL_COMMUNITY): Payer: BLUE CROSS/BLUE SHIELD

## 2015-09-03 ENCOUNTER — Telehealth (HOSPITAL_COMMUNITY): Payer: Self-pay

## 2015-09-03 NOTE — Telephone Encounter (Signed)
Encounter complete. 

## 2015-09-05 ENCOUNTER — Ambulatory Visit (HOSPITAL_COMMUNITY)
Admission: RE | Admit: 2015-09-05 | Discharge: 2015-09-05 | Disposition: A | Discharge status: Home / Self Care | Payer: BLUE CROSS/BLUE SHIELD | Source: Ambulatory Visit | Attending: Cardiology | Admitting: Cardiology

## 2015-09-05 DIAGNOSIS — I2510 Atherosclerotic heart disease of native coronary artery without angina pectoris: Secondary | ICD-10-CM

## 2016-06-25 DIAGNOSIS — H524 Presbyopia: Secondary | ICD-10-CM | POA: Diagnosis not present

## 2016-06-26 DIAGNOSIS — G5762 Lesion of plantar nerve, left lower limb: Secondary | ICD-10-CM | POA: Diagnosis not present

## 2016-07-01 DIAGNOSIS — E782 Mixed hyperlipidemia: Secondary | ICD-10-CM | POA: Diagnosis not present

## 2016-07-01 DIAGNOSIS — Z0000 Encounter for general adult medical examination without abnormal findings: Secondary | ICD-10-CM | POA: Diagnosis not present

## 2016-07-01 DIAGNOSIS — Z125 Encounter for screening for malignant neoplasm of prostate: Secondary | ICD-10-CM | POA: Diagnosis not present

## 2016-07-01 DIAGNOSIS — Z1159 Encounter for screening for other viral diseases: Secondary | ICD-10-CM | POA: Diagnosis not present

## 2016-08-24 ENCOUNTER — Other Ambulatory Visit: Payer: Self-pay | Admitting: *Deleted

## 2016-08-24 DIAGNOSIS — E785 Hyperlipidemia, unspecified: Secondary | ICD-10-CM

## 2016-08-24 MED ORDER — ATORVASTATIN CALCIUM 40 MG PO TABS: 40.0000 mg | ORAL_TABLET | Freq: Every day | ORAL | 1 refills | Status: DC

## 2016-11-02 ENCOUNTER — Other Ambulatory Visit: Payer: Self-pay | Admitting: Cardiology

## 2016-11-02 DIAGNOSIS — E785 Hyperlipidemia, unspecified: Secondary | ICD-10-CM

## 2016-11-20 ENCOUNTER — Other Ambulatory Visit: Payer: Self-pay | Admitting: Cardiology

## 2016-11-20 DIAGNOSIS — E785 Hyperlipidemia, unspecified: Secondary | ICD-10-CM

## 2017-01-19 NOTE — Progress Notes (Signed)
      HPI: FU coronary artery disease. He underwent cardiac catheterization on Nov 16, 2007, and at that time he was found to have a 50-60% PDA and 30-40% lesions in the LAD and circumflex. He had normal LV function. We have been treating him with medical therapy. ETT in June of 2011 was negative adequate. Exercise treadmill ordered at last office visit but not performed. Since I last saw him, the patient denies any dyspnea on exertion, orthopnea, PND, pedal edema, palpitations, syncope or chest pain.   Current Outpatient Prescriptions  Medication Sig Dispense Refill  . aspirin 81 MG tablet Take 81 mg by mouth daily.      Marland Kitchen. atorvastatin (LIPITOR) 40 MG tablet TAKE ONE TABLET BY MOUTH EVERY DAY. MUSTMAKE A APPOINTMENT 30 tablet 0  . Omega-3 Fatty Acids (FISH OIL) 1000 MG CAPS Take 1 capsule by mouth daily.       No current facility-administered medications for this visit.      Past Medical History:  Diagnosis Date  . BRADYCARDIA   . Coronary artery disease   . Hyperlipidemia     Past Surgical History:  Procedure Laterality Date  . HERNIA REPAIR    . TONSILLECTOMY      Social History   Social History  . Marital status: Married    Spouse name: N/A  . Number of children: N/A  . Years of education: N/A   Occupational History  . Not on file.   Social History Main Topics  . Smoking status: Never Smoker  . Smokeless tobacco: Never Used  . Alcohol use Yes  . Drug use: Unknown  . Sexual activity: Not on file   Other Topics Concern  . Not on file   Social History Narrative  . No narrative on file    Family History  Problem Relation Age of Onset  . Coronary artery disease Brother   . Heart attack Brother     ROS: no fevers or chills, productive cough, hemoptysis, dysphasia, odynophagia, melena, hematochezia, dysuria, hematuria, rash, seizure activity, orthopnea, PND, pedal edema, claudication. Remaining systems are negative.  Physical Exam: Well-developed  well-nourished in no acute distress.  Skin is warm and dry.  HEENT is normal.  Neck is supple. No bruits Chest is clear to auscultation with normal expansion.  Cardiovascular exam is regular rate and rhythm.  Abdominal exam nontender or distended. No masses palpated. Extremities show no edema. neuro grossly intact  ECG- sinus bradycardia at a rate of 49. No ST changes. personally reviewed  A/P  1 coronary artery disease-continue aspirin and statin. He is not having symptoms of chest pain or dyspnea. Continue risk factor modification.  2 hyperlipidemia-continue statin. Lipids and liver monitored by primary care.  Olga MillersBrian Pankaj Haack, MD

## 2017-01-26 ENCOUNTER — Other Ambulatory Visit: Payer: Self-pay | Admitting: Cardiology

## 2017-01-26 DIAGNOSIS — E785 Hyperlipidemia, unspecified: Secondary | ICD-10-CM

## 2017-01-28 ENCOUNTER — Encounter: Payer: Self-pay | Admitting: Cardiology

## 2017-01-28 ENCOUNTER — Ambulatory Visit (INDEPENDENT_AMBULATORY_CARE_PROVIDER_SITE_OTHER): Payer: BLUE CROSS/BLUE SHIELD | Admitting: Cardiology

## 2017-01-28 VITALS — BP 126/80 | HR 49 | Ht 73.0 in | Wt 195.0 lb

## 2017-01-28 DIAGNOSIS — I2510 Atherosclerotic heart disease of native coronary artery without angina pectoris: Secondary | ICD-10-CM

## 2017-01-28 DIAGNOSIS — E7800 Pure hypercholesterolemia, unspecified: Secondary | ICD-10-CM

## 2017-01-28 NOTE — Patient Instructions (Signed)
Your physician wants you to follow-up in: ONE YEAR WITH DR CRENSHAW You will receive a reminder letter in the mail two months in advance. If you don't receive a letter, please call our office to schedule the follow-up appointment.   If you need a refill on your cardiac medications before your next appointment, please call your pharmacy.  

## 2017-03-02 ENCOUNTER — Other Ambulatory Visit: Payer: Self-pay | Admitting: Cardiology

## 2017-03-02 DIAGNOSIS — E785 Hyperlipidemia, unspecified: Secondary | ICD-10-CM

## 2017-03-10 DIAGNOSIS — S0181XA Laceration without foreign body of other part of head, initial encounter: Secondary | ICD-10-CM | POA: Diagnosis not present

## 2018-03-09 NOTE — Progress Notes (Signed)
HPI:FU coronary artery disease. He underwent cardiac catheterization on Nov 16, 2007, and at that time he was found to have a 50-60% PDA and 30-40% lesions in the LAD and circumflex. He had normal LV function. We have been treating him with medical therapy. ETT in June of 2011 was negative adequate.  Since I last saw him, the patient denies any dyspnea on exertion, orthopnea, PND, pedal edema, palpitations, syncope or chest pain.   Current Outpatient Medications  Medication Sig Dispense Refill  . aspirin 81 MG tablet Take 81 mg by mouth daily.      Marland Kitchen atorvastatin (LIPITOR) 40 MG tablet TAKE ONE TABLET BY MOUTH EVERY DAY 30 tablet 11   No current facility-administered medications for this visit.      Past Medical History:  Diagnosis Date  . BRADYCARDIA   . Coronary artery disease   . Hyperlipidemia     Past Surgical History:  Procedure Laterality Date  . HERNIA REPAIR    . TONSILLECTOMY      Social History   Socioeconomic History  . Marital status: Married    Spouse name: Not on file  . Number of children: Not on file  . Years of education: Not on file  . Highest education level: Not on file  Occupational History  . Not on file  Social Needs  . Financial resource strain: Not on file  . Food insecurity:    Worry: Not on file    Inability: Not on file  . Transportation needs:    Medical: Not on file    Non-medical: Not on file  Tobacco Use  . Smoking status: Never Smoker  . Smokeless tobacco: Never Used  Substance and Sexual Activity  . Alcohol use: Yes  . Drug use: Not on file  . Sexual activity: Not on file  Lifestyle  . Physical activity:    Days per week: Not on file    Minutes per session: Not on file  . Stress: Not on file  Relationships  . Social connections:    Talks on phone: Not on file    Gets together: Not on file    Attends religious service: Not on file    Active member of club or organization: Not on file    Attends meetings of clubs  or organizations: Not on file    Relationship status: Not on file  . Intimate partner violence:    Fear of current or ex partner: Not on file    Emotionally abused: Not on file    Physically abused: Not on file    Forced sexual activity: Not on file  Other Topics Concern  . Not on file  Social History Narrative  . Not on file    Family History  Problem Relation Age of Onset  . Coronary artery disease Brother   . Heart attack Brother     ROS: no fevers or chills, productive cough, hemoptysis, dysphasia, odynophagia, melena, hematochezia, dysuria, hematuria, rash, seizure activity, orthopnea, PND, pedal edema, claudication. Remaining systems are negative.  Physical Exam: Well-developed well-nourished in no acute distress.  Skin is warm and dry.  HEENT is normal.  Neck is supple.  Chest is clear to auscultation with normal expansion.  Cardiovascular exam is regular rate and rhythm.  Abdominal exam nontender or distended. No masses palpated. Extremities show no edema. neuro grossly intact  ECG-sinus bradycardia with first-degree AV block.  No ST changes.  Personally reviewed  A/P  1 coronary  artery disease-patient denies chest pain or dyspnea.  We discussed lifestyle modification.  Continue aspirin and statin.  2 hyperlipidemia-continue statin.  Lipids and liver are monitored by primary care.  Olga MillersBrian Aashika Carta, MD

## 2018-03-10 ENCOUNTER — Ambulatory Visit (INDEPENDENT_AMBULATORY_CARE_PROVIDER_SITE_OTHER): Payer: BLUE CROSS/BLUE SHIELD | Admitting: Cardiology

## 2018-03-10 ENCOUNTER — Encounter: Payer: Self-pay | Admitting: Cardiology

## 2018-03-10 VITALS — BP 102/80 | HR 51 | Ht 73.0 in | Wt 193.6 lb

## 2018-03-10 DIAGNOSIS — E785 Hyperlipidemia, unspecified: Secondary | ICD-10-CM

## 2018-03-10 DIAGNOSIS — I2510 Atherosclerotic heart disease of native coronary artery without angina pectoris: Secondary | ICD-10-CM | POA: Diagnosis not present

## 2018-03-10 MED ORDER — ATORVASTATIN CALCIUM 40 MG PO TABS: 40.0000 mg | ORAL_TABLET | Freq: Every day | ORAL | 3 refills | Status: DC

## 2018-03-10 NOTE — Patient Instructions (Signed)
Your physician wants you to follow-up in: ONE YEAR WITH DR CRENSHAW You will receive a reminder letter in the mail two months in advance. If you don't receive a letter, please call our office to schedule the follow-up appointment.   If you need a refill on your cardiac medications before your next appointment, please call your pharmacy.  

## 2018-07-04 DIAGNOSIS — H2513 Age-related nuclear cataract, bilateral: Secondary | ICD-10-CM | POA: Diagnosis not present

## 2018-08-17 DIAGNOSIS — L821 Other seborrheic keratosis: Secondary | ICD-10-CM | POA: Diagnosis not present

## 2018-08-17 DIAGNOSIS — L570 Actinic keratosis: Secondary | ICD-10-CM | POA: Diagnosis not present

## 2018-08-17 DIAGNOSIS — D225 Melanocytic nevi of trunk: Secondary | ICD-10-CM | POA: Diagnosis not present

## 2018-08-17 DIAGNOSIS — L814 Other melanin hyperpigmentation: Secondary | ICD-10-CM | POA: Diagnosis not present

## 2018-08-17 DIAGNOSIS — Z85828 Personal history of other malignant neoplasm of skin: Secondary | ICD-10-CM | POA: Diagnosis not present

## 2018-08-18 DIAGNOSIS — I2510 Atherosclerotic heart disease of native coronary artery without angina pectoris: Secondary | ICD-10-CM | POA: Diagnosis not present

## 2018-08-18 DIAGNOSIS — Z125 Encounter for screening for malignant neoplasm of prostate: Secondary | ICD-10-CM | POA: Diagnosis not present

## 2018-08-18 DIAGNOSIS — Z0000 Encounter for general adult medical examination without abnormal findings: Secondary | ICD-10-CM | POA: Diagnosis not present

## 2018-08-18 DIAGNOSIS — E782 Mixed hyperlipidemia: Secondary | ICD-10-CM | POA: Diagnosis not present

## 2018-08-22 DIAGNOSIS — G5762 Lesion of plantar nerve, left lower limb: Secondary | ICD-10-CM | POA: Diagnosis not present

## 2018-08-22 DIAGNOSIS — M25522 Pain in left elbow: Secondary | ICD-10-CM | POA: Diagnosis not present

## 2018-09-12 DIAGNOSIS — Z1211 Encounter for screening for malignant neoplasm of colon: Secondary | ICD-10-CM | POA: Diagnosis not present

## 2019-03-23 ENCOUNTER — Other Ambulatory Visit: Payer: Self-pay | Admitting: Cardiology

## 2019-03-23 DIAGNOSIS — E785 Hyperlipidemia, unspecified: Secondary | ICD-10-CM

## 2019-03-23 MED ORDER — ATORVASTATIN CALCIUM 40 MG PO TABS: 40.0000 mg | ORAL_TABLET | Freq: Every day | ORAL | 0 refills | Status: DC

## 2019-03-23 NOTE — Telephone Encounter (Signed)
Requested Prescriptions   Signed Prescriptions Disp Refills  . atorvastatin (LIPITOR) 40 MG tablet 90 tablet 0    Sig: Take 1 tablet (40 mg total) by mouth daily.    Authorizing Provider: Lelon Perla    Ordering User: Raelene Bott, Cordelia Bessinger L

## 2019-03-23 NOTE — Telephone Encounter (Signed)
New Message   *STAT* If patient is at the pharmacy, call can be transferred to refill team.   1. Which medications need to be refilled? (please list name of each medication and dose if known) atorvastatin (LIPITOR) 40 MG tablet (rx #[id number])  2. Which pharmacy/location (including street and city if local pharmacy) is medication to be sent to? [place], [place]  3. Do they need a 30 day or 90 day supply? 90 day

## 2019-05-08 ENCOUNTER — Ambulatory Visit: Payer: BC Managed Care – PPO | Admitting: Cardiology

## 2019-07-04 DIAGNOSIS — H25813 Combined forms of age-related cataract, bilateral: Secondary | ICD-10-CM | POA: Diagnosis not present

## 2019-07-21 NOTE — Progress Notes (Signed)
HPI: FU coronary artery disease. He underwent cardiac catheterization on Nov 16, 2007, and at that time he was found to have a 50-60% PDA and 30-40% lesions in the LAD and circumflex. He had normal LV function. We have been treating him with medical therapy. ETT in June of 2011 was negative adequate.Since I last saw him,patient has noticed some increased fatigue and dyspnea on exertion.  No chest pain.  He has not been able to exercise routinely due to Covid.  Current Outpatient Medications  Medication Sig Dispense Refill  . aspirin 81 MG tablet Take 81 mg by mouth daily.      Marland Kitchen atorvastatin (LIPITOR) 40 MG tablet Take 1 tablet (40 mg total) by mouth daily. 90 tablet 0   No current facility-administered medications for this visit.     Past Medical History:  Diagnosis Date  . BRADYCARDIA   . Coronary artery disease   . Hyperlipidemia     Past Surgical History:  Procedure Laterality Date  . HERNIA REPAIR    . TONSILLECTOMY      Social History   Socioeconomic History  . Marital status: Married    Spouse name: Not on file  . Number of children: Not on file  . Years of education: Not on file  . Highest education level: Not on file  Occupational History  . Not on file  Tobacco Use  . Smoking status: Never Smoker  . Smokeless tobacco: Never Used  Substance and Sexual Activity  . Alcohol use: Yes  . Drug use: Not on file  . Sexual activity: Not on file  Other Topics Concern  . Not on file  Social History Narrative  . Not on file   Social Determinants of Health   Financial Resource Strain:   . Difficulty of Paying Living Expenses: Not on file  Food Insecurity:   . Worried About Charity fundraiser in the Last Year: Not on file  . Ran Out of Food in the Last Year: Not on file  Transportation Needs:   . Lack of Transportation (Medical): Not on file  . Lack of Transportation (Non-Medical): Not on file  Physical Activity:   . Days of Exercise per Week: Not on  file  . Minutes of Exercise per Session: Not on file  Stress:   . Feeling of Stress : Not on file  Social Connections:   . Frequency of Communication with Friends and Family: Not on file  . Frequency of Social Gatherings with Friends and Family: Not on file  . Attends Religious Services: Not on file  . Active Member of Clubs or Organizations: Not on file  . Attends Archivist Meetings: Not on file  . Marital Status: Not on file  Intimate Partner Violence:   . Fear of Current or Ex-Partner: Not on file  . Emotionally Abused: Not on file  . Physically Abused: Not on file  . Sexually Abused: Not on file    Family History  Problem Relation Age of Onset  . Coronary artery disease Brother   . Heart attack Brother     ROS: no fevers or chills, productive cough, hemoptysis, dysphasia, odynophagia, melena, hematochezia, dysuria, hematuria, rash, seizure activity, orthopnea, PND, pedal edema, claudication. Remaining systems are negative.  Physical Exam: Well-developed well-nourished in no acute distress.  Skin is warm and dry.  HEENT is normal.  Neck is supple.  Chest is clear to auscultation with normal expansion.  Cardiovascular exam is regular  rate and rhythm.  Abdominal exam nontender or distended. No masses palpated. Extremities show no edema. neuro grossly intact  ECG-sinus rhythm with PACs, no ST changes.  Personally reviewed  A/P  1 coronary artery disease-patient without chest pain.  Continue aspirin, statin and lifestyle modification.  I will arrange a calcium score for risk stratification.  2 hyperlipidemia-continue statin.  Lipids and liver monitored by primary care.  3 dyspnea-plan echocardiogram to assess LV function.  Olga Millers, MD

## 2019-07-27 ENCOUNTER — Ambulatory Visit (INDEPENDENT_AMBULATORY_CARE_PROVIDER_SITE_OTHER): Payer: BC Managed Care – PPO | Admitting: Cardiology

## 2019-07-27 ENCOUNTER — Encounter: Payer: Self-pay | Admitting: Cardiology

## 2019-07-27 ENCOUNTER — Encounter (INDEPENDENT_AMBULATORY_CARE_PROVIDER_SITE_OTHER): Payer: Self-pay

## 2019-07-27 ENCOUNTER — Other Ambulatory Visit: Payer: Self-pay

## 2019-07-27 ENCOUNTER — Other Ambulatory Visit: Payer: Self-pay | Admitting: Cardiology

## 2019-07-27 VITALS — BP 122/82 | HR 66 | Temp 97.2°F | Ht 74.0 in | Wt 202.0 lb

## 2019-07-27 DIAGNOSIS — I2510 Atherosclerotic heart disease of native coronary artery without angina pectoris: Secondary | ICD-10-CM

## 2019-07-27 DIAGNOSIS — E785 Hyperlipidemia, unspecified: Secondary | ICD-10-CM

## 2019-07-27 DIAGNOSIS — R0602 Shortness of breath: Secondary | ICD-10-CM | POA: Diagnosis not present

## 2019-07-27 NOTE — Telephone Encounter (Signed)
*  STAT* If patient is at the pharmacy, call can be transferred to refill team.   1. Which medications need to be refilled? (please list name of each medication and dose if known) atorvastatin (LIPITOR) 40 MG tablet  2. Which pharmacy/location (including street and city if local pharmacy) is medication to be sent to? [facility] [address] [place], [place] - [address]  3. Do they need a 30 day or 90 day supply? 90 day  Patient has been out of medication for 10 days

## 2019-07-27 NOTE — Patient Instructions (Signed)
Medication Instructions:  NO CHANGE *If you need a refill on your cardiac medications before your next appointment, please call your pharmacy*  Lab Work: If you have labs (blood work) drawn today and your tests are completely normal, you will receive your results only by: Marland Kitchen MyChart Message (if you have MyChart) OR . A paper copy in the mail If you have any lab test that is abnormal or we need to change your treatment, we will call you to review the results.  Testing/Procedures: Your physician has requested that you have an echocardiogram. Echocardiography is a painless test that uses sound waves to create images of your heart. It provides your doctor with information about the size and shape of your heart and how well your heart's chambers and valves are working. This procedure takes approximately one hour. There are no restrictions for this procedure.[address]  CT CARDIAC CALCIUM SCORE AT [address]   Follow-Up: At [facility], you and your health needs are our priority.  As part of our continuing mission to provide you with exceptional heart care, we have created designated Provider Care Teams.  These Care Teams include your primary Cardiologist (physician) and Advanced Practice Providers (APPs -  Physician Assistants and Nurse Practitioners) who all work together to provide you with the care you need, when you need it.  Your next appointment:   12 month(s)  The format for your next appointment:   Either In Person or Virtual  Provider:   You may see Olga Millers MD or one of the following Advanced Practice Providers on your designated Care Team:    Corine Shelter, PA-C  [place], [place]  Edd Fabian, [place]

## 2019-08-07 ENCOUNTER — Inpatient Hospital Stay: Admission: RE | Admit: 2019-08-07 | Payer: BC Managed Care – PPO | Source: Ambulatory Visit

## 2019-08-07 ENCOUNTER — Other Ambulatory Visit (HOSPITAL_COMMUNITY): Payer: BC Managed Care – PPO

## 2019-08-22 ENCOUNTER — Ambulatory Visit (HOSPITAL_COMMUNITY): Payer: BC Managed Care – PPO | Attending: Cardiology

## 2019-08-22 ENCOUNTER — Ambulatory Visit (INDEPENDENT_AMBULATORY_CARE_PROVIDER_SITE_OTHER)
Admission: RE | Admit: 2019-08-22 | Discharge: 2019-08-22 | Disposition: A | Discharge status: Home / Self Care | Payer: Self-pay | Source: Ambulatory Visit | Attending: Cardiology | Admitting: Cardiology

## 2019-08-22 ENCOUNTER — Other Ambulatory Visit: Payer: Self-pay

## 2019-08-22 DIAGNOSIS — R0602 Shortness of breath: Secondary | ICD-10-CM | POA: Insufficient documentation

## 2019-08-23 ENCOUNTER — Telehealth: Payer: Self-pay | Admitting: *Deleted

## 2019-08-23 DIAGNOSIS — R931 Abnormal findings on diagnostic imaging of heart and coronary circulation: Secondary | ICD-10-CM

## 2019-08-23 DIAGNOSIS — I2510 Atherosclerotic heart disease of native coronary artery without angina pectoris: Secondary | ICD-10-CM

## 2019-08-23 NOTE — Telephone Encounter (Addendum)
Spoke with pt, Aware of dr Ludwig Clarks recommendations. Order placed for stress test. Patient aware will need covid testing prior to test.  ----- Message from Lewayne Bunting, MD sent at 08/23/2019  7:09 AM EST ----- Elevated Ca score; schedule stress nuclear study then fu ov with me Olga Millers

## 2019-09-01 ENCOUNTER — Telehealth (HOSPITAL_COMMUNITY): Payer: Self-pay | Admitting: *Deleted

## 2019-09-01 ENCOUNTER — Other Ambulatory Visit (HOSPITAL_COMMUNITY)
Admission: RE | Admit: 2019-09-01 | Discharge: 2019-09-01 | Disposition: A | Discharge status: Home / Self Care | Payer: BC Managed Care – PPO | Source: Ambulatory Visit | Attending: Cardiology | Admitting: Cardiology

## 2019-09-01 DIAGNOSIS — Z20822 Contact with and (suspected) exposure to covid-19: Secondary | ICD-10-CM | POA: Insufficient documentation

## 2019-09-01 DIAGNOSIS — Z01812 Encounter for preprocedural laboratory examination: Secondary | ICD-10-CM | POA: Diagnosis not present

## 2019-09-01 LAB — SARS CORONAVIRUS 2 (TAT 6-24 HRS): SARS Coronavirus 2: NEGATIVE

## 2019-09-01 NOTE — Telephone Encounter (Signed)
Close encounter 

## 2019-09-05 ENCOUNTER — Ambulatory Visit (HOSPITAL_COMMUNITY)
Admission: RE | Admit: 2019-09-05 | Discharge: 2019-09-05 | Disposition: A | Discharge status: Home / Self Care | Payer: BC Managed Care – PPO | Source: Ambulatory Visit | Attending: Cardiology | Admitting: Cardiology

## 2019-09-05 ENCOUNTER — Other Ambulatory Visit: Payer: Self-pay

## 2019-09-05 DIAGNOSIS — R931 Abnormal findings on diagnostic imaging of heart and coronary circulation: Secondary | ICD-10-CM | POA: Diagnosis not present

## 2019-09-05 DIAGNOSIS — I2510 Atherosclerotic heart disease of native coronary artery without angina pectoris: Secondary | ICD-10-CM

## 2019-09-05 LAB — MYOCARDIAL PERFUSION IMAGING
Estimated workload: 7.2 METS
Exercise duration (min): 6 min
Exercise duration (sec): 1 s
MPHR: 157 {beats}/min
Peak HR: 139 {beats}/min
Percent HR: 88 %
Rest HR: 60 {beats}/min

## 2019-09-05 MED ORDER — TECHNETIUM TC 99M TETROFOSMIN IV KIT
9.9000 | PACK | Freq: Once | INTRAVENOUS | Status: AC | PRN
  Administered 2019-09-05: 9.9 via INTRAVENOUS
  Filled 2019-09-05: qty 10

## 2019-09-05 MED ORDER — TECHNETIUM TC 99M TETROFOSMIN IV KIT
29.8000 | PACK | Freq: Once | INTRAVENOUS | Status: AC | PRN
  Administered 2019-09-05: 29.8 via INTRAVENOUS
  Filled 2019-09-05: qty 30

## 2019-09-08 DIAGNOSIS — L818 Other specified disorders of pigmentation: Secondary | ICD-10-CM | POA: Diagnosis not present

## 2019-09-08 DIAGNOSIS — L821 Other seborrheic keratosis: Secondary | ICD-10-CM | POA: Diagnosis not present

## 2019-09-08 DIAGNOSIS — B351 Tinea unguium: Secondary | ICD-10-CM | POA: Diagnosis not present

## 2019-09-08 DIAGNOSIS — D485 Neoplasm of uncertain behavior of skin: Secondary | ICD-10-CM | POA: Diagnosis not present

## 2019-09-08 DIAGNOSIS — D225 Melanocytic nevi of trunk: Secondary | ICD-10-CM | POA: Diagnosis not present

## 2019-09-08 DIAGNOSIS — L570 Actinic keratosis: Secondary | ICD-10-CM | POA: Diagnosis not present

## 2019-10-19 ENCOUNTER — Telehealth (HOSPITAL_COMMUNITY): Payer: Self-pay | Admitting: *Deleted

## 2019-10-19 NOTE — Telephone Encounter (Signed)
Close encounter 

## 2019-12-07 ENCOUNTER — Telehealth: Payer: Self-pay | Admitting: Cardiology

## 2019-12-07 NOTE — Telephone Encounter (Signed)
New Message  Pt called and wanted to talk to Dr. Jens Som. Stated he wanted to know if it was ok to play basketball if the results from his test comes out ok

## 2019-12-07 NOTE — Telephone Encounter (Signed)
I spoke to the patient and reviewed his stress test and Echo.  I told him that there was no indication that he needs to limit his activities.  He verbalized understanding.

## 2019-12-28 DIAGNOSIS — L821 Other seborrheic keratosis: Secondary | ICD-10-CM | POA: Diagnosis not present

## 2020-01-22 DIAGNOSIS — M5137 Other intervertebral disc degeneration, lumbosacral region: Secondary | ICD-10-CM | POA: Diagnosis not present

## 2020-01-22 DIAGNOSIS — M9906 Segmental and somatic dysfunction of lower extremity: Secondary | ICD-10-CM | POA: Diagnosis not present

## 2020-01-22 DIAGNOSIS — M9904 Segmental and somatic dysfunction of sacral region: Secondary | ICD-10-CM | POA: Diagnosis not present

## 2020-01-22 DIAGNOSIS — M9903 Segmental and somatic dysfunction of lumbar region: Secondary | ICD-10-CM | POA: Diagnosis not present

## 2020-01-29 DIAGNOSIS — M9903 Segmental and somatic dysfunction of lumbar region: Secondary | ICD-10-CM | POA: Diagnosis not present

## 2020-01-29 DIAGNOSIS — M9904 Segmental and somatic dysfunction of sacral region: Secondary | ICD-10-CM | POA: Diagnosis not present

## 2020-01-29 DIAGNOSIS — M5137 Other intervertebral disc degeneration, lumbosacral region: Secondary | ICD-10-CM | POA: Diagnosis not present

## 2020-01-29 DIAGNOSIS — M9906 Segmental and somatic dysfunction of lower extremity: Secondary | ICD-10-CM | POA: Diagnosis not present

## 2020-01-31 ENCOUNTER — Other Ambulatory Visit: Payer: Self-pay

## 2020-01-31 DIAGNOSIS — E785 Hyperlipidemia, unspecified: Secondary | ICD-10-CM

## 2020-01-31 MED ORDER — ATORVASTATIN CALCIUM 40 MG PO TABS: 40.0000 mg | ORAL_TABLET | Freq: Every day | ORAL | 3 refills | Status: DC

## 2020-01-31 NOTE — Addendum Note (Signed)
Addended by: Adline Peals on: 01/31/2020 01:49 PM   Modules accepted: Orders

## 2020-02-01 DIAGNOSIS — M9906 Segmental and somatic dysfunction of lower extremity: Secondary | ICD-10-CM | POA: Diagnosis not present

## 2020-02-01 DIAGNOSIS — M5137 Other intervertebral disc degeneration, lumbosacral region: Secondary | ICD-10-CM | POA: Diagnosis not present

## 2020-02-01 DIAGNOSIS — M9904 Segmental and somatic dysfunction of sacral region: Secondary | ICD-10-CM | POA: Diagnosis not present

## 2020-02-01 DIAGNOSIS — M9903 Segmental and somatic dysfunction of lumbar region: Secondary | ICD-10-CM | POA: Diagnosis not present

## 2020-05-08 DIAGNOSIS — H25813 Combined forms of age-related cataract, bilateral: Secondary | ICD-10-CM | POA: Diagnosis not present

## 2020-05-08 DIAGNOSIS — H527 Unspecified disorder of refraction: Secondary | ICD-10-CM | POA: Diagnosis not present

## 2020-05-08 DIAGNOSIS — H52203 Unspecified astigmatism, bilateral: Secondary | ICD-10-CM | POA: Diagnosis not present

## 2020-07-23 ENCOUNTER — Encounter: Payer: Self-pay | Admitting: *Deleted

## 2020-08-21 NOTE — Progress Notes (Signed)
      HPI: FU coronary artery disease. He underwent cardiac catheterization on Nov 16, 2007, and at that time he was found to have a 50-60% PDA and 30-40% lesions in the LAD and circumflex. He had normal LV function. We have been treating him medically.  Calcium score February 2021 2793. Ascending thoracic aorta measured 42 mm. Echocardiogram February 2021 showed normal LV function, grade 1 diastolic dysfunction, trace aortic insufficiency. Nuclear study February 2021 showed ejection fraction 55 to 65% and no ischemia. Since I last saw him,the patient has dyspnea with more extreme activities but not with routine activities. It is relieved with rest. It is not associated with chest pain. There is no orthopnea, PND or pedal edema. There is no syncope or palpitations. There is no exertional chest pain.   Current Outpatient Medications  Medication Sig Dispense Refill  . aspirin 81 MG tablet Take 81 mg by mouth daily.    Marland Kitchen atorvastatin (LIPITOR) 40 MG tablet Take 1 tablet (40 mg total) by mouth daily. 90 tablet 3   No current facility-administered medications for this visit.     Past Medical History:  Diagnosis Date  . BRADYCARDIA   . Coronary artery disease   . Hyperlipidemia     Past Surgical History:  Procedure Laterality Date  . HERNIA REPAIR    . TONSILLECTOMY      Social History   Socioeconomic History  . Marital status: Married    Spouse name: Not on file  . Number of children: Not on file  . Years of education: Not on file  . Highest education level: Not on file  Occupational History  . Not on file  Tobacco Use  . Smoking status: Never Smoker  . Smokeless tobacco: Never Used  Substance and Sexual Activity  . Alcohol use: Yes  . Drug use: Not on file  . Sexual activity: Not on file  Other Topics Concern  . Not on file  Social History Narrative  . Not on file   Social Determinants of Health   Financial Resource Strain: Not on file  Food Insecurity: Not on file   Transportation Needs: Not on file  Physical Activity: Not on file  Stress: Not on file  Social Connections: Not on file  Intimate Partner Violence: Not on file    Family History  Problem Relation Age of Onset  . Coronary artery disease Brother   . Heart attack Brother     ROS: no fevers or chills, productive cough, hemoptysis, dysphasia, odynophagia, melena, hematochezia, dysuria, hematuria, rash, seizure activity, orthopnea, PND, pedal edema, claudication. Remaining systems are negative.  Physical Exam: Well-developed well-nourished in no acute distress.  Skin is warm and dry.  HEENT is normal.  Neck is supple.  Chest is clear to auscultation with normal expansion.  Cardiovascular exam is regular rate and rhythm.  Abdominal exam nontender or distended. No masses palpated. Extremities show no edema. neuro grossly intact  ECG-sinus bradycardia at a rate of 53, no ST changes.  Personally reviewed  A/P  1 coronary artery disease-previous calcium score significantly elevated.  Nuclear study showed no ischemia and he has not had any chest pain.  Plan to continue medical therapy.  Continue aspirin and statin.  2 hyperlipidemia-given documented coronary disease and significant elevation in calcium score increase Lipitor to 80 mg daily.  Check lipids and liver in 12 weeks.  3 dilated ascending thoracic aorta-schedule follow-up CTA.  Olga Millers, MD

## 2020-09-03 ENCOUNTER — Ambulatory Visit (INDEPENDENT_AMBULATORY_CARE_PROVIDER_SITE_OTHER): Payer: BC Managed Care – PPO | Admitting: Cardiology

## 2020-09-03 ENCOUNTER — Encounter: Payer: Self-pay | Admitting: Cardiology

## 2020-09-03 ENCOUNTER — Other Ambulatory Visit: Payer: Self-pay

## 2020-09-03 VITALS — BP 116/80 | HR 53 | Ht 73.0 in | Wt 206.0 lb

## 2020-09-03 DIAGNOSIS — E785 Hyperlipidemia, unspecified: Secondary | ICD-10-CM

## 2020-09-03 DIAGNOSIS — I712 Thoracic aortic aneurysm, without rupture, unspecified: Secondary | ICD-10-CM

## 2020-09-03 DIAGNOSIS — I2510 Atherosclerotic heart disease of native coronary artery without angina pectoris: Secondary | ICD-10-CM | POA: Diagnosis not present

## 2020-09-03 MED ORDER — ATORVASTATIN CALCIUM 80 MG PO TABS: 80.0000 mg | ORAL_TABLET | Freq: Every day | ORAL | 3 refills | Status: DC

## 2020-09-03 NOTE — Patient Instructions (Signed)
Medication Instructions:   INCREASE ATORVASTATIN 80 MG ONCE DAILY= 2 OF THE 40 MG TABLETS ONCE DAILY  *If you need a refill on your cardiac medications before your next appointment, please call your pharmacy*   Lab Work:  Your physician recommends that you return for lab work in: 12 WEEKS=FASTING  If you have labs (blood work) drawn today and your tests are completely normal, you will receive your results only by: Marland Kitchen MyChart Message (if you have MyChart) OR . A paper copy in the mail If you have any lab test that is abnormal or we need to change your treatment, we will call you to review the results.  Follow-Up: At [facility], you and your health needs are our priority.  As part of our continuing mission to provide you with exceptional heart care, we have created designated Provider Care Teams.  These Care Teams include your primary Cardiologist (physician) and Advanced Practice Providers (APPs -  Physician Assistants and Nurse Practitioners) who all work together to provide you with the care you need, when you need it.  We recommend signing up for the patient portal called "MyChart".  Sign up information is provided on this After Visit Summary.  MyChart is used to connect with patients for Virtual Visits (Telemedicine).  Patients are able to view lab/test results, encounter notes, upcoming appointments, etc.  Non-urgent messages can be sent to your provider as well.   To learn more about what you can do with MyChart, go to [email or web address].    Your next appointment:   12 month(s)  The format for your next appointment:   In Person  Provider:   Olga Millers, MD

## 2020-09-09 DIAGNOSIS — L821 Other seborrheic keratosis: Secondary | ICD-10-CM | POA: Diagnosis not present

## 2020-09-09 DIAGNOSIS — B351 Tinea unguium: Secondary | ICD-10-CM | POA: Diagnosis not present

## 2020-09-09 DIAGNOSIS — D2272 Melanocytic nevi of left lower limb, including hip: Secondary | ICD-10-CM | POA: Diagnosis not present

## 2020-09-09 DIAGNOSIS — D225 Melanocytic nevi of trunk: Secondary | ICD-10-CM | POA: Diagnosis not present

## 2020-09-09 DIAGNOSIS — L570 Actinic keratosis: Secondary | ICD-10-CM | POA: Diagnosis not present

## 2020-12-13 ENCOUNTER — Encounter: Payer: Self-pay | Admitting: *Deleted

## 2020-12-17 ENCOUNTER — Other Ambulatory Visit: Payer: Self-pay | Admitting: *Deleted

## 2020-12-17 ENCOUNTER — Encounter: Payer: Self-pay | Admitting: *Deleted

## 2020-12-17 DIAGNOSIS — I712 Thoracic aortic aneurysm, without rupture, unspecified: Secondary | ICD-10-CM

## 2020-12-18 ENCOUNTER — Telehealth: Payer: Self-pay | Admitting: Cardiovascular Disease

## 2020-12-18 NOTE — Telephone Encounter (Signed)
Spoke with patient regarding the Thursday 01/30/21 1:20 pm CTA chest/aorta scheduled at [facility] ---[address], arrival time is 1:00pm for check in---liquids only 4 hours prior--patient to have lab work 1 week prior to study. Will mail information  to patient and he voiced his understanding.

## 2021-01-06 ENCOUNTER — Telehealth: Payer: Self-pay | Admitting: Cardiology

## 2021-01-06 DIAGNOSIS — E785 Hyperlipidemia, unspecified: Secondary | ICD-10-CM | POA: Diagnosis not present

## 2021-01-06 LAB — HEPATIC FUNCTION PANEL
ALT: 31 IU/L (ref 0–44)
AST: 25 IU/L (ref 0–40)
Albumin: 4.3 g/dL (ref 3.8–4.8)
Alkaline Phosphatase: 58 IU/L (ref 44–121)
Bilirubin Total: 1.1 mg/dL (ref 0.0–1.2)
Bilirubin, Direct: 0.29 mg/dL (ref 0.00–0.40)
Total Protein: 6.7 g/dL (ref 6.0–8.5)

## 2021-01-06 LAB — LIPID PANEL
Chol/HDL Ratio: 2.8 ratio (ref 0.0–5.0)
Cholesterol, Total: 126 mg/dL (ref 100–199)
HDL: 45 mg/dL (ref >39)
LDL Chol Calc (NIH): 66 mg/dL (ref 0–99)
Triglycerides: 76 mg/dL (ref 0–149)
VLDL Cholesterol Cal: 15 mg/dL (ref 5–40)

## 2021-01-06 NOTE — Telephone Encounter (Signed)
Returned call to patient- patient in office getting lab work.  He states answers were answered.   He is getting lipid hepatic today and will return for BMET 1 week prior to CTA

## 2021-01-06 NOTE — Telephone Encounter (Signed)
New message:      Patient calling to speak with someone concering him coming to give blood. Patient has a Ct Scan.

## 2021-01-30 ENCOUNTER — Other Ambulatory Visit: Payer: Self-pay

## 2021-01-30 ENCOUNTER — Ambulatory Visit
Admission: RE | Admit: 2021-01-30 | Discharge: 2021-01-30 | Disposition: A | Discharge status: Home / Self Care | Payer: Medicare HMO | Source: Ambulatory Visit | Attending: Cardiology | Admitting: Cardiology

## 2021-01-30 DIAGNOSIS — I712 Thoracic aortic aneurysm, without rupture, unspecified: Secondary | ICD-10-CM

## 2021-01-30 MED ORDER — IOPAMIDOL (ISOVUE-370) INJECTION 76%
75.0000 mL | Freq: Once | INTRAVENOUS | Status: AC | PRN
  Administered 2021-01-30: 75 mL via INTRAVENOUS

## 2021-08-20 ENCOUNTER — Other Ambulatory Visit: Payer: Self-pay | Admitting: Emergency Medicine

## 2021-08-20 DIAGNOSIS — I712 Thoracic aortic aneurysm, without rupture, unspecified: Secondary | ICD-10-CM

## 2021-09-11 ENCOUNTER — Other Ambulatory Visit: Payer: Medicare HMO

## 2021-09-22 ENCOUNTER — Ambulatory Visit
Admission: RE | Admit: 2021-09-22 | Discharge: 2021-09-22 | Disposition: A | Discharge status: Home / Self Care | Payer: Medicare HMO | Source: Ambulatory Visit | Attending: Emergency Medicine | Admitting: Emergency Medicine

## 2021-09-22 DIAGNOSIS — I712 Thoracic aortic aneurysm, without rupture, unspecified: Secondary | ICD-10-CM

## 2021-10-24 ENCOUNTER — Encounter: Payer: Self-pay | Admitting: Cardiology

## 2021-11-11 ENCOUNTER — Telehealth: Payer: Self-pay | Admitting: Cardiology

## 2021-11-11 NOTE — Telephone Encounter (Signed)
Calling because patient had episode calling to see if Dr. Jens Som thinks he need be betablocker. Please advise  ?

## 2021-11-11 NOTE — Telephone Encounter (Signed)
Spoke with dr beam, he reports the patient was in the same restaurant as him and had an episode of dizziness and lightheaded and syncope. He placed a zio monitor on the patient and dr beam reports the patient has atrial fib/flutter 17% of the time. He reports the patient also had bradycardia down to 41 bpm. He is going to fax his office note and the zio monitor strips to [place]. Will get the patient in to be seen.  ?

## 2021-11-11 NOTE — Telephone Encounter (Signed)
Left message for pt to call to change follow up appointment ?

## 2021-11-12 MED ORDER — APIXABAN 5 MG PO TABS: 5.0000 mg | ORAL_TABLET | Freq: Two times a day (BID) | ORAL | 6 refills | Status: DC

## 2021-11-12 NOTE — Telephone Encounter (Signed)
-----   Message from Lewayne Bunting, MD sent at 11/12/2021 10:59 AM EDT ----- ?Patient apparently seen recently with near syncope.  His monitor shows sinus with episodes of atrial fibrillation with rapid ventricular response.  He also has a posttermination pause of 2.8 seconds.  Would discontinue aspirin.  Begin apixaban 5 mg twice daily.  I am hesitant to add beta-blocker or Cardizem in the setting of history of syncope and posttermination pause.  Would arrange follow-up in the atrial fibrillation clinic. ?Olga Millers ? ?

## 2021-11-12 NOTE — Telephone Encounter (Signed)
Spoke with pt, patient very apprehensive about starting blood thinner. Explained atrial fib to the patient and the reason for the blood thinner. He is on his way out of town and [facility] in [facility] location to get the eliquis transferred to them. Follow up scheduled next available when patients schedule allows. ?

## 2021-11-12 NOTE — Telephone Encounter (Signed)
Monitor report located in [place] - printed and given to [employer] ?

## 2021-11-14 NOTE — Progress Notes (Signed)
HPI: FU coronary artery disease and atrial fibrillation. He underwent cardiac catheterization on Nov 16, 2007, and at that time he was found to have a 50-60% PDA and 30-40% lesions in the LAD and circumflex. He had normal LV function. We have been treating him medically.  Calcium score February 2021 2793. Ascending thoracic aorta measured 42 mm. Echocardiogram February 2021 showed normal LV function, grade 1 diastolic dysfunction, trace aortic insufficiency. Nuclear study February 2021 showed ejection fraction 55 to 65% and no ischemia.  CTA July 2022 showed stable 4.2 cm ascending thoracic aortic aneurysm.  Abdominal ultrasound March 2023 showed no aneurysm.  Recently had a presyncopal episode in a restaurant.  Primary care placed a Zio patch which showed paroxysmal atrial fibrillation/flutter and also bradycardia with heart rates as low as 41; posttermination pause of 2.8 seconds.  Patient placed on apixaban.  Beta-blocker and Cardizem avoided in the setting of occasional bradycardia with posttermination pauses.  Since I last saw him, patient has dyspnea on exertion but no orthopnea, PND, pedal edema or chest pain.  He did have a near syncopal episode in the restaurant.  He developed mild abdominal pain, feeling of hot, diaphoresis and mild nausea followed by near syncope.  He has had no recurrent symptoms and no syncope.  He occasionally has palpitations.  Current Outpatient Medications  Medication Sig Dispense Refill   apixaban (ELIQUIS) 5 MG TABS tablet Take 1 tablet (5 mg total) by mouth 2 (two) times daily. 60 tablet 6   aspirin 81 MG tablet Take 81 mg by mouth daily.     atorvastatin (LIPITOR) 80 MG tablet Take 1 tablet by mouth once daily 90 tablet 0   Cholecalciferol (VITAMIN D) 50 MCG (2000 UT) CAPS Take 1,000 Units by mouth daily.     No current facility-administered medications for this visit.     Past Medical History:  Diagnosis Date   BRADYCARDIA    Coronary artery disease     Hyperlipidemia     Past Surgical History:  Procedure Laterality Date   HERNIA REPAIR     TONSILLECTOMY      Social History   Socioeconomic History   Marital status: Married    Spouse name: Not on file   Number of children: Not on file   Years of education: Not on file   Highest education level: Not on file  Occupational History   Not on file  Tobacco Use   Smoking status: Never   Smokeless tobacco: Never  Substance and Sexual Activity   Alcohol use: Yes   Drug use: Not on file   Sexual activity: Not on file  Other Topics Concern   Not on file  Social History Narrative   Not on file   Social Determinants of Health   Financial Resource Strain: Not on file  Food Insecurity: Not on file  Transportation Needs: Not on file  Physical Activity: Not on file  Stress: Not on file  Social Connections: Not on file  Intimate Partner Violence: Not on file    Family History  Problem Relation Age of Onset   Coronary artery disease Brother    Heart attack Brother     ROS: no fevers or chills, productive cough, hemoptysis, dysphasia, odynophagia, melena, hematochezia, dysuria, hematuria, rash, seizure activity, orthopnea, PND, pedal edema, claudication. Remaining systems are negative.  Physical Exam: Well-developed well-nourished in no acute distress.  Skin is warm and dry.  HEENT is normal.  Neck is supple.  Chest is clear to auscultation with normal expansion.  Cardiovascular exam is regular rate and rhythm.  Abdominal exam nontender or distended. No masses palpated. Extremities show no edema. neuro grossly intact  ECG-sinus bradycardia at a rate of 52, no ST changes.  Personally reviewed  A/P  1 paroxysmal atrial fibrillation/flutter-I have personally reviewed the patient's outpatient monitor.  He has sinus bradycardia with intermittent paroxysmal atrial fibrillation/flutter.  One of his strips shows probable atrial flutter with one-to-one conduction with heart rate  of 280.  He also has sinus bradycardia with heart rate as low as 41.  I am hesitant to add beta-blockade or Cardizem in the setting.  I will ask electrophysiology to review for recommendations concerning ablation versus antiarrhythmic therapy.  Embolic risk factors include age 33 and coronary artery disease.  We will treat with apixaban 5 mg twice daily.  Check TSH.  Repeat echocardiogram.  Check hemoglobin and renal function.  Patient does have some dyspnea on exertion but denies chest pain.  I will arrange a PET scan to rule out ischemia.  2 hyperlipidemia-continue statin.  Check lipids and liver.  3 dilated ascending thoracic aorta-plan follow-up CTA July 2023.  4 dyspnea on exertion-as outlined above I will arrange a PET scan to rule out ischemia.  Olga Millers, MD

## 2021-11-19 ENCOUNTER — Other Ambulatory Visit: Payer: Self-pay | Admitting: Cardiology

## 2021-11-19 DIAGNOSIS — E785 Hyperlipidemia, unspecified: Secondary | ICD-10-CM

## 2021-11-28 ENCOUNTER — Encounter: Payer: Self-pay | Admitting: Cardiology

## 2021-11-28 ENCOUNTER — Ambulatory Visit: Payer: Medicare HMO | Admitting: Cardiology

## 2021-11-28 VITALS — BP 136/84 | HR 52 | Ht 74.0 in | Wt 206.0 lb

## 2021-11-28 DIAGNOSIS — I480 Paroxysmal atrial fibrillation: Secondary | ICD-10-CM | POA: Diagnosis not present

## 2021-11-28 DIAGNOSIS — I712 Thoracic aortic aneurysm, without rupture, unspecified: Secondary | ICD-10-CM | POA: Diagnosis not present

## 2021-11-28 DIAGNOSIS — R0609 Other forms of dyspnea: Secondary | ICD-10-CM

## 2021-11-28 DIAGNOSIS — E785 Hyperlipidemia, unspecified: Secondary | ICD-10-CM | POA: Diagnosis not present

## 2021-11-28 LAB — CBC
Hematocrit: 41.7 % (ref 37.5–51.0)
Hemoglobin: 14.7 g/dL (ref 13.0–17.7)
MCH: 29.6 pg (ref 26.6–33.0)
MCHC: 35.3 g/dL (ref 31.5–35.7)
MCV: 84 fL (ref 79–97)
Platelets: 217 10*3/uL (ref 150–450)
RBC: 4.96 x10E6/uL (ref 4.14–5.80)
RDW: 11.9 % (ref 11.6–15.4)
WBC: 6.9 10*3/uL (ref 3.4–10.8)

## 2021-11-28 LAB — COMPREHENSIVE METABOLIC PANEL
ALT: 27 IU/L (ref 0–44)
AST: 22 IU/L (ref 0–40)
Albumin/Globulin Ratio: 2 (ref 1.2–2.2)
Albumin: 4.5 g/dL (ref 3.8–4.8)
Alkaline Phosphatase: 61 IU/L (ref 44–121)
BUN/Creatinine Ratio: 14 (ref 10–24)
BUN: 12 mg/dL (ref 8–27)
Bilirubin Total: 0.8 mg/dL (ref 0.0–1.2)
CO2: 27 mmol/L (ref 20–29)
Calcium: 9.5 mg/dL (ref 8.6–10.2)
Chloride: 103 mmol/L (ref 96–106)
Creatinine, Ser: 0.86 mg/dL (ref 0.76–1.27)
Globulin, Total: 2.2 g/dL (ref 1.5–4.5)
Glucose: 103 mg/dL — ABNORMAL HIGH (ref 70–99)
Potassium: 5 mmol/L (ref 3.5–5.2)
Sodium: 141 mmol/L (ref 134–144)
Total Protein: 6.7 g/dL (ref 6.0–8.5)
eGFR: 96 mL/min/{1.73_m2} (ref >59)

## 2021-11-28 LAB — LIPID PANEL
Chol/HDL Ratio: 2.5 ratio (ref 0.0–5.0)
Cholesterol, Total: 116 mg/dL (ref 100–199)
HDL: 46 mg/dL (ref >39)
LDL Chol Calc (NIH): 56 mg/dL (ref 0–99)
Triglycerides: 63 mg/dL (ref 0–149)
VLDL Cholesterol Cal: 14 mg/dL (ref 5–40)

## 2021-11-28 NOTE — Patient Instructions (Signed)
Testing/Procedures:  How to Prepare for Your Cardiac PET/CT Stress Test:  1. Please do not take these medications before your test:   Medications that may interfere with the cardiac pharmacological stress agent (ex. nitrates or beta-blockers) the day of the exam. Theophylline containing medications for 12 hours. Dipyridamole 48 hours prior to the test. Your remaining medications may be taken with water.  2. Nothing to eat or drink, except water, 3 hours prior to arrival time.   NO caffeine/decaffeinated products, or chocolate 12 hours prior to arrival.  3. NO perfume, cologne or lotion  4. Total time is 1 to 2 hours; you may want to bring reading material for the waiting time.  5. Please report to Admitting at the [facility] Main Entrance 60 minutes early for your test.  [address] [place], [place] [address]  Diabetic Preparation:  Hold oral medications. You may take NPH and Lantus insulin. Do not take Humalog or Humulin R (Regular Insulin) the day of your test. Check blood sugars prior to leaving the house. If able to eat breakfast prior to 3 hour fasting, you may take all medications, including your insulin, Do not worry if you miss your breakfast dose of insulin - start at your next meal.  IF YOU THINK YOU MAY BE PREGNANT, OR ARE NURSING PLEASE INFORM THE TECHNOLOGIST.  In preparation for your appointment, medication and supplies will be purchased.  Appointment availability is limited, so if you need to cancel or reschedule, please call the Radiology Department at [phone]  24 hours in advance to avoid a cancellation fee of $100.00  What to Expect After you Arrive:  Once you arrive and check in for your appointment, you will be taken to a preparation room within the Radiology Department.  A technologist or Nurse will obtain your medical history, verify that you are correctly prepped for the exam, and explain the procedure.  Afterwards,  an IV will be  started in your arm and electrodes will be placed on your skin for EKG monitoring during the stress portion of the exam. Then you will be escorted to the PET/CT scanner.  There, staff will get you positioned on the scanner and obtain a blood pressure and EKG.  During the exam, you will continue to be connected to the EKG and blood pressure machines.  A small, safe amount of a radioactive tracer will be injected in your IV to obtain a series of pictures of your heart along with an injection of a stress agent.    After your Exam:  It is recommended that you eat a meal and drink a caffeinated beverage to counter act any effects of the stress agent.  Drink plenty of fluids for the remainder of the day and urinate frequently for the first couple of hours after the exam.  Your doctor will inform you of your test results within 7-10 business days.  For questions about your test or how to prepare for your test, please call: Rockwell Alexandria, Cardiac Imaging Nurse Navigator  Larey Brick, Cardiac Imaging Nurse Navigator Office: [phone]    Your physician has requested that you have an echocardiogram. Echocardiography is a painless test that uses sound waves to create images of your heart. It provides your doctor with information about the size and shape of your heart and how well your heart's chambers and valves are working. This procedure takes approximately one hour. There are no restrictions for this procedure. [address]   Follow-Up:  At [facility], you and your health needs are our priority.  As part of our continuing mission to provide you with exceptional heart care, we have created designated Provider Care Teams.  These Care Teams include your primary Cardiologist (physician) and Advanced Practice Providers (APPs -  Physician Assistants and Nurse Practitioners) who all work together to provide you with the care you need, when you need it.  We recommend signing up for the patient  portal called "MyChart".  Sign up information is provided on this After Visit Summary.  MyChart is used to connect with patients for Virtual Visits (Telemedicine).  Patients are able to view lab/test results, encounter notes, upcoming appointments, etc.  Non-urgent messages can be sent to your provider as well.   To learn more about what you can do with MyChart, go to [email or web address].    Your next appointment:   3 month(s)  The format for your next appointment:   In Person  Provider:   Olga Millers MD      Important Information About Sugar

## 2021-12-03 ENCOUNTER — Encounter: Payer: Self-pay | Admitting: *Deleted

## 2021-12-22 ENCOUNTER — Other Ambulatory Visit (HOSPITAL_BASED_OUTPATIENT_CLINIC_OR_DEPARTMENT_OTHER): Payer: Self-pay | Admitting: *Deleted

## 2021-12-22 DIAGNOSIS — I712 Thoracic aortic aneurysm, without rupture, unspecified: Secondary | ICD-10-CM

## 2021-12-23 ENCOUNTER — Ambulatory Visit (HOSPITAL_COMMUNITY): Payer: Medicare HMO | Attending: Cardiology

## 2021-12-23 DIAGNOSIS — I480 Paroxysmal atrial fibrillation: Secondary | ICD-10-CM | POA: Diagnosis present

## 2021-12-24 ENCOUNTER — Telehealth (HOSPITAL_BASED_OUTPATIENT_CLINIC_OR_DEPARTMENT_OTHER): Payer: Self-pay | Admitting: Cardiology

## 2021-12-24 LAB — ECHOCARDIOGRAM COMPLETE
Area-P 1/2: 2.76 cm2
S' Lateral: 2.8 cm

## 2021-12-24 NOTE — Telephone Encounter (Signed)
Left message for patient to call and discuss scheduling the CTA chest/aorta ordered by Dr. Crenshaw 

## 2021-12-31 ENCOUNTER — Other Ambulatory Visit: Payer: Self-pay | Admitting: *Deleted

## 2021-12-31 DIAGNOSIS — I712 Thoracic aortic aneurysm, without rupture, unspecified: Secondary | ICD-10-CM

## 2022-01-18 NOTE — Progress Notes (Unsigned)
Electrophysiology Office Note   Date:  01/18/2022   ID:  [name] [name], DOB May 06, 1956, MRN [id number]  PCP:  Vivien Presto, MD  Cardiologist:  Jens Som Primary Electrophysiologist:  Lavra Imler Jorja Loa, MD    Chief Complaint: AF   History of Present Illness: [name] [name] is a 66 y.o. male who is being seen today for the evaluation of AF at the request of Jens Som, Madolyn Frieze, MD. Presenting today for electrophysiology evaluation.  He has a history significant for coronary artery disease and atrial fibrillation.  He had a catheterization in 2009 that showed mild to moderate nonobstructive coronary artery disease.  He has been treated medically.  He wore a Zio patch recently due to an episode of presyncope at [employer].  Zio patch showed episodes of atrial fibrillation, atrial flutter, bradycardia with heart rates of 41 bpm.  He was started on Eliquis.  He had occasional bradycardia and posttermination pauses and thus rate control was avoided.  Today, he denies*** symptoms of palpitations, chest pain, shortness of breath, orthopnea, PND, lower extremity edema, claudication, dizziness, presyncope, syncope, bleeding, or neurologic sequela. The patient is tolerating medications without difficulties.    Past Medical History:  Diagnosis Date   BRADYCARDIA    Coronary artery disease    Hyperlipidemia    Past Surgical History:  Procedure Laterality Date   HERNIA REPAIR     TONSILLECTOMY       Current Outpatient Medications  Medication Sig Dispense Refill   apixaban (ELIQUIS) 5 MG TABS tablet Take 1 tablet (5 mg total) by mouth 2 (two) times daily. 60 tablet 6   aspirin 81 MG tablet Take 81 mg by mouth daily.     atorvastatin (LIPITOR) 80 MG tablet Take 1 tablet by mouth once daily 90 tablet 0   Cholecalciferol (VITAMIN D) 50 MCG (2000 UT) CAPS Take 1,000 Units by mouth daily.     No current facility-administered medications for this visit.    Allergies:   Patient has  no known allergies.   Social History:  The patient  reports that he has never smoked. He has never used smokeless tobacco. He reports current alcohol use.   Family History:  The patient's family history includes Coronary artery disease in his brother; Heart attack in his brother.    ROS:  Please see the history of present illness.   Otherwise, review of systems is positive for none.   All other systems are reviewed and negative.    PHYSICAL EXAM: VS:  There were no vitals taken for this visit. , BMI There is no height or weight on file to calculate BMI. GEN: Well nourished, well developed, in no acute distress  HEENT: normal  Neck: no JVD, carotid bruits, or masses Cardiac: ***RRR; no murmurs, rubs, or gallops,no edema  Respiratory:  clear to auscultation bilaterally, normal work of breathing GI: soft, nontender, nondistended, + BS MS: no deformity or atrophy  Skin: warm and dry Neuro:  Strength and sensation are intact Psych: euthymic mood, full affect  EKG:  EKG {ACTION; IS/IS ONG:29528413} ordered today. Personal review of the ekg ordered *** shows ***  Recent Labs: 11/28/2021: ALT 27; BUN 12; Creatinine, Ser 0.86; Hemoglobin 14.7; Platelets 217; Potassium 5.0; Sodium 141    Lipid Panel     Component Value Date/Time   CHOL 116 11/28/2021 1125   TRIG 63 11/28/2021 1125   HDL 46 11/28/2021 1125   CHOLHDL 2.5 11/28/2021 1125   CHOLHDL 3 03/02/2013 2440  VLDL 10.6 03/02/2013 0907   LDLCALC 56 11/28/2021 1125     Wt Readings from Last 3 Encounters:  11/28/21 206 lb (93.4 kg)  09/03/20 206 lb (93.4 kg)  09/05/19 202 lb (91.6 kg)      Other studies Reviewed: Additional studies/ records that were reviewed today include: TTE 12/24/21  Review of the above records today demonstrates:   1. Left ventricular ejection fraction, by estimation, is 60 to 65%. The  left ventricle has normal function. The left ventricle has no regional  wall motion abnormalities. Left ventricular  diastolic parameters were  normal. The average left ventricular  global longitudinal strain is -23.2 %. The global longitudinal strain is  normal.   2. Right ventricular systolic function is normal. The right ventricular  size is normal. There is normal pulmonary artery systolic pressure.   3. Left atrial size was mild to moderately dilated.   4. The mitral valve is normal in structure. Trivial mitral valve  regurgitation. No evidence of mitral stenosis.   5. The aortic valve is tricuspid. Aortic valve regurgitation is trivial.  No aortic stenosis is present.   6. Aortic dilatation noted. There is mild dilatation of the ascending  aorta, measuring 43 mm.   7. The inferior vena cava is normal in size with greater than 50%  respiratory variability, suggesting right atrial pressure of 3 mmHg.    ASSESSMENT AND PLAN:  1.  Paroxysmal atrial fibrillation/atrial flutter: Sinus bradycardia and thus no rate control.  He has strips with heart rates of up to 280 bpm.  He also has sinus bradycardia.  Currently on Eliquis 5 mg twice daily.  CHA2DS2-VASc of 2.  He would prefer a rhythm control strategy.***  2.  Coronary artery disease: Moderate disease in 2009.  Continue current management per primary cardiology.  3.  Secondary hypercoagulable state: Currently on Eliquis for atrial fibrillation as above.  Case discussed with primary cardiology  Current medicines are reviewed at length with the patient today.   The patient {ACTIONS; HAS/DOES NOT HAVE:19233} concerns regarding his medicines.  The following changes were made today:  {NONE DEFAULTED:18576}  Labs/ tests ordered today include: *** No orders of the defined types were placed in this encounter.    Disposition:   FU with Filicia Scogin {gen number 2-11:[id number]} {Days to years:10300}  Signed, Dnyla Antonetti Jorja Loa, MD  01/18/2022 3:07 PM     [facility] [name] [place] [address] [phone]  (office) [phone] (fax)

## 2022-01-19 ENCOUNTER — Encounter: Payer: Self-pay | Admitting: *Deleted

## 2022-01-19 ENCOUNTER — Encounter: Payer: Self-pay | Admitting: Cardiology

## 2022-01-19 ENCOUNTER — Ambulatory Visit: Payer: Medicare HMO | Admitting: Cardiology

## 2022-01-19 VITALS — BP 112/74 | HR 54 | Ht 74.0 in | Wt 207.8 lb

## 2022-01-19 DIAGNOSIS — I480 Paroxysmal atrial fibrillation: Secondary | ICD-10-CM | POA: Diagnosis not present

## 2022-01-19 DIAGNOSIS — D6869 Other thrombophilia: Secondary | ICD-10-CM

## 2022-01-19 DIAGNOSIS — Z01812 Encounter for preprocedural laboratory examination: Secondary | ICD-10-CM

## 2022-01-19 MED ORDER — AMIODARONE HCL 200 MG PO TABS: ORAL_TABLET | ORAL | 3 refills | Status: DC

## 2022-01-19 NOTE — Patient Instructions (Addendum)
Medication Instructions:  Your physician has recommended you make the following change in your medication:  START Amiodarone   - take 200 mg TWICE a day for one month, then reduce and  - take 200 mg ONCE a day  *If you need a refill on your cardiac medications before your next appointment, please call your pharmacy*   Lab Work: Pre procedure labs -- see procedure instruction letter:  BMP & CBC  If you have labs (blood work) drawn today and your tests are completely normal, you will receive your results only by: MyChart Message (if you have MyChart) OR A paper copy in the mail If you have any lab test that is abnormal or we need to change your treatment, we will call you to review the results.   Testing/Procedures: Your physician has requested that you have cardiac CT within 7 days PRIOR to your ablation. Cardiac computed tomography (CT) is a painless test that uses an x-ray machine to take clear, detailed pictures of your heart.  Please follow instruction below located under "other instructions". You will get a call from our office to schedule the date for this test.  Your physician has recommended that you have an ablation. Catheter ablation is a medical procedure used to treat some cardiac arrhythmias (irregular heartbeats). During catheter ablation, a long, thin, flexible tube is put into a blood vessel in your groin (upper thigh), or neck. This tube is called an ablation catheter. It is then guided to your heart through the blood vessel. Radio frequency waves destroy small areas of heart tissue where abnormal heartbeats may cause an arrhythmia to start. Please follow instruction letter given to you today.   Follow-Up: At [facility], you and your health needs are our priority.  As part of our continuing mission to provide you with exceptional heart care, we have created designated Provider Care Teams.  These Care Teams include your primary Cardiologist (physician) and Advanced  Practice Providers (APPs -  Physician Assistants and Nurse Practitioners) who all work together to provide you with the care you need, when you need it.  Your next appointment:   1 month(s) after your ablation  The format for your next appointment:   In Person  Provider:   [facility] clinic   Thank you for choosing [facility]!!   Dory Horn, RN [phone]    Other Instructions    Amiodarone Tablets What is this medication? AMIODARONE (a MEE oh da rone) prevents and treats a fast or irregular heartbeat (arrhythmia). It works by slowing down overactive electric signals in the heart, which stabilizes your heart rhythm. It belongs to a group of medications called antiarrhythmics. This medicine may be used for other purposes; ask your health care provider or pharmacist if you have questions. COMMON BRAND NAME(S): Cordarone, Pacerone What should I tell my care team before I take this medication? They need to know if you have any of these conditions: Liver disease Lung disease Other heart problems Thyroid disease An unusual or allergic reaction to amiodarone, iodine, other medications, foods, dyes, or preservatives Pregnant or trying to get pregnant Breast-feeding How should I use this medication? Take this medication by mouth with a glass of water. Follow the directions on the prescription label. You can take this medication with or without food. However, you should always take it the same way each time. Take your doses at regular intervals. Do not take your medication more often than directed. Do not stop taking except on the advice  of your care team. A special MedGuide will be given to you by the pharmacist with each prescription and refill. Be sure to read this information carefully each time. Talk to your care team regarding the use of this medication in children. Special care may be needed. Overdosage: If you think you have taken too much of this medicine contact a poison  control center or emergency room at once. NOTE: This medicine is only for you. Do not share this medicine with others. What if I miss a dose? If you miss a dose, take it as soon as you can. If it is almost time for your next dose, take only that dose. Do not take double or extra doses. What may interact with this medication? Do not take this medication with any of the following: Abarelix Apomorphine Arsenic trioxide Certain antibiotics like erythromycin, gemifloxacin, levofloxacin, pentamidine Certain medications for depression like amoxapine, tricyclic antidepressants Certain medications for fungal infections like fluconazole, itraconazole, ketoconazole, posaconazole, voriconazole Certain medications for irregular heartbeat like disopyramide, dronedarone, ibutilide, propafenone, sotalol Certain medications for malaria like chloroquine, halofantrine Cisapride Droperidol Haloperidol [place] Maprotiline Methadone Phenothiazines like chlorpromazine, mesoridazine, thioridazine Pimozide Ranolazine Red yeast rice Vardenafil This medication may also interact with the following: Antiviral medications for HIV or AIDS Certain medications for blood pressure, heart disease, irregular heart beat Certain medications for cholesterol like atorvastatin, cerivastatin, lovastatin, simvastatin Certain medications for hepatitis C like sofosbuvir and ledipasvir; sofosbuvir Certain medications for seizures like phenytoin Certain medications for thyroid problems Certain medications that treat or prevent blood clots like warfarin Cholestyramine Cimetidine Clopidogrel Cyclosporine Dextromethorphan Diuretics Dofetilide Fentanyl General anesthetics Grapefruit juice Lidocaine Loratadine Methotrexate Other medications that prolong the QT interval (cause an abnormal heart rhythm) Procainamide Quinidine Rifabutin, rifampin, or rifapentine St. John's Wort Trazodone Ziprasidone This list may not  describe all possible interactions. Give your health care provider a list of all the medicines, herbs, non-prescription drugs, or dietary supplements you use. Also tell them if you smoke, drink alcohol, or use illegal drugs. Some items may interact with your medicine. What should I watch for while using this medication? Your condition will be monitored closely when you first begin therapy. Often, this medication is first started in a hospital or other monitored health care setting. Once you are on maintenance therapy, visit your care team for regular checks on your progress. Because your condition and use of this medication carry some risk, it is a good idea to carry an identification card, necklace or bracelet with details of your condition, medications, and care team. You may get drowsy or dizzy. Do not drive, use machinery, or do anything that needs mental alertness until you know how this medication affects you. Do not stand or sit up quickly, especially if you are an older patient. This reduces the risk of dizzy or fainting spells. This medication can make you more sensitive to the sun. Keep out of the sun. If you cannot avoid being in the sun, wear protective clothing and use sunscreen. Do not use sun lamps or tanning beds/booths. You should have regular eye exams before and during treatment. Call your care team if you have blurred vision, see halos, or your eyes become sensitive to light. Your eyes may get dry. It may be helpful to use a lubricating eye solution or artificial tears solution. If you are going to have surgery or a procedure that requires contrast dyes, tell your care team that you are taking this medication. What side effects may I notice from  receiving this medication? Side effects that you should report to your care team as soon as possible: Allergic reactions--skin rash, itching, hives, swelling of the face, lips, tongue, or throat Bluish-gray skin Change in vision such as blurry  vision, seeing halos around lights, vision loss Heart failure--shortness of breath, swelling of the ankles, feet, or hands, sudden weight gain, unusual weakness or fatigue Heart rhythm changes--fast or irregular heartbeat, dizziness, feeling faint or lightheaded, chest pain, trouble breathing High thyroid levels (hyperthyroidism)--fast or irregular heartbeat, weight loss, excessive sweating or sensitivity to heat, tremors or shaking, anxiety, nervousness, irregular menstrual cycle or spotting Liver injury--right upper belly pain, loss of appetite, nausea, light-colored stool, dark yellow or brown urine, yellowing skin or eyes, unusual weakness or fatigue Low thyroid levels (hypothyroidism)--unusual weakness or fatigue, sensitivity to cold, constipation, hair loss, dry skin, weight gain, feelings of depression Lung injury--shortness of breath or trouble breathing, cough, spitting up blood, chest pain, fever Pain, tingling, or numbness in the hands or feet, muscle weakness, trouble walking, loss of balance or coordination Side effects that usually do not require medical attention (report to your care team if they continue or are bothersome): Nausea Vomiting This list may not describe all possible side effects. Call your doctor for medical advice about side effects. You may report side effects to FDA at 1-800-FDA-1088. Where should I keep my medication? Keep out of the reach of children and pets. Store at room temperature between 20 and 25 degrees C (68 and 77 degrees F). Protect from light. Keep container tightly closed. Throw away any unused medication after the expiration date. NOTE: This sheet is a summary. It may not cover all possible information. If you have questions about this medicine, talk to your doctor, pharmacist, or health care provider.  2023 Elsevier/Gold Standard (2020-08-23 00:00:00)   Cardiac Ablation Cardiac ablation is a procedure to destroy (ablate) some heart tissue that is  sending bad signals. These bad signals cause problems in heart rhythm. The heart has many areas that make these signals. If there are problems in these areas, they can make the heart beat in a way that is not normal. Destroying some tissues can help make the heart rhythm normal. Tell your doctor about: Any allergies you have. All medicines you are taking. These include vitamins, herbs, eye drops, creams, and over-the-counter medicines. Any problems you or family members have had with medicines that make you fall asleep (anesthetics). Any blood disorders you have. Any surgeries you have had. Any medical conditions you have, such as kidney failure. Whether you are pregnant or may be pregnant. What are the risks? This is a safe procedure. But problems may occur, including: Infection. Bruising and bleeding. Bleeding into the chest. Stroke or blood clots. Damage to nearby areas of your body. Allergies to medicines or dyes. The need for a pacemaker if the normal system is damaged. Failure of the procedure to treat the problem. What happens before the procedure? Medicines Ask your doctor about: Changing or stopping your normal medicines. This is important. Taking aspirin and ibuprofen. Do not take these medicines unless your doctor tells you to take them. Taking other medicines, vitamins, herbs, and supplements. General instructions Follow instructions from your doctor about what you cannot eat or drink. Plan to have someone take you home from the hospital or clinic. If you will be going home right after the procedure, plan to have someone with you for 24 hours. Ask your doctor what steps will be taken to prevent infection.  What happens during the procedure?  An IV tube will be put into one of your veins. You will be given a medicine to help you relax. The skin on your neck or groin will be numbed. A cut (incision) will be made in your neck or groin. A needle will be put through your cut  and into a large vein. A tube (catheter) will be put into the needle. The tube will be moved to your heart. Dye may be put through the tube. This helps your doctor see your heart. Small devices (electrodes) on the tube will send out signals. A type of energy will be used to destroy some heart tissue. The tube will be taken out. Pressure will be held on your cut. This helps stop bleeding. A bandage will be put over your cut. The exact procedure may vary among doctors and hospitals. What happens after the procedure? You will be watched until you leave the hospital or clinic. This includes checking your heart rate, breathing rate, oxygen, and blood pressure. Your cut will be watched for bleeding. You will need to lie still for a few hours. Do not drive for 24 hours or as long as your doctor tells you. Summary Cardiac ablation is a procedure to destroy some heart tissue. This is done to treat heart rhythm problems. Tell your doctor about any medical conditions you may have. Tell him or her about all medicines you are taking to treat them. This is a safe procedure. But problems may occur. These include infection, bruising, bleeding, and damage to nearby areas of your body. Follow what your doctor tells you about food and drink. You may also be told to change or stop some of your medicines. After the procedure, do not drive for 24 hours or as long as your doctor tells you. This information is not intended to replace advice given to you by your health care provider. Make sure you discuss any questions you have with your health care provider. Document Revised: 06/01/2019 Document Reviewed: 06/01/2019 Elsevier Patient Education  2023 [employer].

## 2022-01-22 LAB — BASIC METABOLIC PANEL
BUN/Creatinine Ratio: 20 (ref 10–24)
BUN: 17 mg/dL (ref 8–27)
CO2: 26 mmol/L (ref 20–29)
Calcium: 8.9 mg/dL (ref 8.6–10.2)
Chloride: 105 mmol/L (ref 96–106)
Creatinine, Ser: 0.85 mg/dL (ref 0.76–1.27)
Glucose: 94 mg/dL (ref 70–99)
Potassium: 4.7 mmol/L (ref 3.5–5.2)
Sodium: 138 mmol/L (ref 134–144)
eGFR: 96 mL/min/{1.73_m2} (ref >59)

## 2022-01-28 NOTE — Progress Notes (Signed)
HPI: FU coronary artery disease and atrial fibrillation. He underwent cardiac catheterization on Nov 16, 2007, and at that time he was found to have a 50-60% PDA and 30-40% lesions in the LAD and circumflex. He had normal LV function. We have been treating him medically.  Calcium score February 2021 2793. Ascending thoracic aorta measured 42 mm.  Nuclear study February 2021 showed ejection fraction 55 to 65% and no ischemia.  CTA July 2022 showed stable 4.2 cm ascending thoracic aortic aneurysm.  Abdominal ultrasound March 2023 showed no aneurysm.  Recently had Zio patch which showed paroxysmal atrial fibrillation/flutter and also bradycardia with heart rates as low as 41; posttermination pause of 2.8 seconds.  Patient placed on apixaban.  Beta-blocker and Cardizem avoided in the setting of occasional bradycardia with posttermination pauses.  Echocardiogram June 2023 showed normal LV function, mild to moderate left atrial enlargement, trace aortic insufficiency and mildly dilated ascending aorta at 43 mm.  Patient seen by Dr. Elberta Fortis and atrial fibrillation ablation has been scheduled.  Since I last saw him, he has dyspnea with more vigorous activities but not routine activities.  No orthopnea, PND, pedal edema or chest pain.  He has occasional bouts of atrial fibrillation and has now been placed on amiodarone.  Current Outpatient Medications  Medication Sig Dispense Refill   amiodarone (PACERONE) 200 MG tablet Take 1 tablet TWICE daily for one month, then take 1 tablet ONCE daily 90 tablet 3   apixaban (ELIQUIS) 5 MG TABS tablet Take 1 tablet (5 mg total) by mouth 2 (two) times daily. 60 tablet 6   atorvastatin (LIPITOR) 80 MG tablet Take 1 tablet by mouth once daily 90 tablet 0   Cholecalciferol (VITAMIN D) 50 MCG (2000 UT) CAPS Take 1,000 Units by mouth daily.     No current facility-administered medications for this visit.     Past Medical History:  Diagnosis Date   BRADYCARDIA     Coronary artery disease    Hyperlipidemia     Past Surgical History:  Procedure Laterality Date   HERNIA REPAIR     TONSILLECTOMY      Social History   Socioeconomic History   Marital status: Married    Spouse name: Not on file   Number of children: Not on file   Years of education: Not on file   Highest education level: Not on file  Occupational History   Not on file  Tobacco Use   Smoking status: Never   Smokeless tobacco: Never  Substance and Sexual Activity   Alcohol use: Yes   Drug use: Not on file   Sexual activity: Not on file  Other Topics Concern   Not on file  Social History Narrative   Not on file   Social Determinants of Health   Financial Resource Strain: Not on file  Food Insecurity: Not on file  Transportation Needs: Not on file  Physical Activity: Not on file  Stress: Not on file  Social Connections: Not on file  Intimate Partner Violence: Not on file    Family History  Problem Relation Age of Onset   Coronary artery disease Brother    Heart attack Brother     ROS: no fevers or chills, productive cough, hemoptysis, dysphasia, odynophagia, melena, hematochezia, dysuria, hematuria, rash, seizure activity, orthopnea, PND, pedal edema, claudication. Remaining systems are negative.  Physical Exam: Well-developed well-nourished in no acute distress.  Skin is warm and dry.  HEENT is normal.  Neck is  supple.  Bruit Chest is clear to auscultation with normal expansion.  Cardiovascular exam is regular rate and rhythm.  Abdominal exam nontender or distended. No masses palpated. Extremities show no edema. neuro grossly intact  A/P  1 paroxysmal atrial fibrillation patient has been evaluated by Dr. Elberta Fortis and atrial fibrillation ablation has been scheduled.  Continue apixaban.  He has also been placed on amiodarone.  This will likely be continued until several months following ablation and then discontinue long-term.  2 dyspnea on exertion-patient  is scheduled for PET scan to rule out ischemia.  3 dilated thoracic aorta-follow-up CTA July 2023.  4 hyperlipidemia-continue statin.  5 coronary artery disease-based on previous elevation of calcium score.  Continue statin.  He is not on aspirin given need for apixaban.  6 bruit-schedule carotid Dopplers.  Olga Millers, MD

## 2022-01-29 ENCOUNTER — Ambulatory Visit
Admission: RE | Admit: 2022-01-29 | Discharge: 2022-01-29 | Disposition: A | Discharge status: Home / Self Care | Payer: Medicare HMO | Source: Ambulatory Visit | Attending: Cardiology | Admitting: Cardiology

## 2022-01-29 DIAGNOSIS — I712 Thoracic aortic aneurysm, without rupture, unspecified: Secondary | ICD-10-CM

## 2022-01-29 MED ORDER — IOPAMIDOL (ISOVUE-300) INJECTION 61%
75.0000 mL | Freq: Once | INTRAVENOUS | Status: AC | PRN
  Administered 2022-01-29: 75 mL via INTRAVENOUS

## 2022-02-03 ENCOUNTER — Encounter: Payer: Self-pay | Admitting: *Deleted

## 2022-02-11 ENCOUNTER — Encounter: Payer: Self-pay | Admitting: Cardiology

## 2022-02-11 ENCOUNTER — Ambulatory Visit: Payer: Medicare HMO | Admitting: Cardiology

## 2022-02-11 VITALS — BP 126/76 | HR 94 | Ht 74.0 in | Wt 204.6 lb

## 2022-02-11 DIAGNOSIS — R0609 Other forms of dyspnea: Secondary | ICD-10-CM | POA: Diagnosis not present

## 2022-02-11 DIAGNOSIS — E785 Hyperlipidemia, unspecified: Secondary | ICD-10-CM

## 2022-02-11 DIAGNOSIS — I480 Paroxysmal atrial fibrillation: Secondary | ICD-10-CM | POA: Diagnosis not present

## 2022-02-11 DIAGNOSIS — R0989 Other specified symptoms and signs involving the circulatory and respiratory systems: Secondary | ICD-10-CM | POA: Diagnosis not present

## 2022-02-11 DIAGNOSIS — I712 Thoracic aortic aneurysm, without rupture, unspecified: Secondary | ICD-10-CM

## 2022-02-11 DIAGNOSIS — I2510 Atherosclerotic heart disease of native coronary artery without angina pectoris: Secondary | ICD-10-CM

## 2022-02-11 NOTE — Patient Instructions (Signed)
    Testing/Procedures:  Your physician has requested that you have a carotid duplex. This test is an ultrasound of the carotid arteries in your neck. It looks at blood flow through these arteries that supply the brain with blood. Allow one hour for this exam. There are no restrictions or special instructions. [facility]   Follow-Up: At [facility], you and your health needs are our priority.  As part of our continuing mission to provide you with exceptional heart care, we have created designated Provider Care Teams.  These Care Teams include your primary Cardiologist (physician) and Advanced Practice Providers (APPs -  Physician Assistants and Nurse Practitioners) who all work together to provide you with the care you need, when you need it.  We recommend signing up for the patient portal called "MyChart".  Sign up information is provided on this After Visit Summary.  MyChart is used to connect with patients for Virtual Visits (Telemedicine).  Patients are able to view lab/test results, encounter notes, upcoming appointments, etc.  Non-urgent messages can be sent to your provider as well.   To learn more about what you can do with MyChart, go to [email or web address].    Your next appointment:   6 month(s)  The format for your next appointment:   In Person  Provider:   Olga Millers MD

## 2022-02-14 ENCOUNTER — Other Ambulatory Visit: Payer: Self-pay | Admitting: Cardiology

## 2022-02-14 DIAGNOSIS — E785 Hyperlipidemia, unspecified: Secondary | ICD-10-CM

## 2022-02-16 ENCOUNTER — Other Ambulatory Visit: Payer: Self-pay | Admitting: Cardiology

## 2022-02-16 DIAGNOSIS — E785 Hyperlipidemia, unspecified: Secondary | ICD-10-CM

## 2022-02-20 ENCOUNTER — Ambulatory Visit (HOSPITAL_COMMUNITY): Payer: Medicare HMO

## 2022-03-06 ENCOUNTER — Ambulatory Visit (HOSPITAL_COMMUNITY)
Admission: RE | Admit: 2022-03-06 | Discharge: 2022-03-06 | Disposition: A | Discharge status: Home / Self Care | Payer: Medicare HMO | Source: Ambulatory Visit | Attending: Cardiology | Admitting: Cardiology

## 2022-03-06 DIAGNOSIS — R0989 Other specified symptoms and signs involving the circulatory and respiratory systems: Secondary | ICD-10-CM | POA: Insufficient documentation

## 2022-04-23 ENCOUNTER — Ambulatory Visit: Payer: Medicare HMO | Attending: Cardiology

## 2022-04-23 DIAGNOSIS — Z01812 Encounter for preprocedural laboratory examination: Secondary | ICD-10-CM

## 2022-04-23 DIAGNOSIS — I480 Paroxysmal atrial fibrillation: Secondary | ICD-10-CM

## 2022-04-24 LAB — CBC
Hematocrit: 42.2 % (ref 37.5–51.0)
Hemoglobin: 14 g/dL (ref 13.0–17.7)
MCH: 29.5 pg (ref 26.6–33.0)
MCHC: 33.2 g/dL (ref 31.5–35.7)
MCV: 89 fL (ref 79–97)
Platelets: 220 10*3/uL (ref 150–450)
RBC: 4.74 x10E6/uL (ref 4.14–5.80)
RDW: 12.7 % (ref 11.6–15.4)
WBC: 7.2 10*3/uL (ref 3.4–10.8)

## 2022-04-24 LAB — BASIC METABOLIC PANEL
BUN/Creatinine Ratio: 13 (ref 10–24)
BUN: 13 mg/dL (ref 8–27)
CO2: 27 mmol/L (ref 20–29)
Calcium: 9.2 mg/dL (ref 8.6–10.2)
Chloride: 103 mmol/L (ref 96–106)
Creatinine, Ser: 0.97 mg/dL (ref 0.76–1.27)
Glucose: 87 mg/dL (ref 70–99)
Potassium: 4.9 mmol/L (ref 3.5–5.2)
Sodium: 139 mmol/L (ref 134–144)
eGFR: 86 mL/min/{1.73_m2} (ref >59)

## 2022-04-27 ENCOUNTER — Telehealth: Payer: Self-pay | Admitting: *Deleted

## 2022-04-27 NOTE — Telephone Encounter (Signed)
Pt updated on time to arrive to procedure next weeek has changed. Aware to arrive at 10:00 am morning of ablation. Informed I would resend CT & ablation instructions back to him via mychart. Reviewed both instructions. Patient verbalized understanding and agreeable to plan.

## 2022-04-28 ENCOUNTER — Telehealth: Payer: Self-pay | Admitting: Cardiology

## 2022-04-28 ENCOUNTER — Telehealth (HOSPITAL_COMMUNITY): Payer: Self-pay | Admitting: *Deleted

## 2022-04-28 NOTE — Telephone Encounter (Signed)
Attempted to call patient regarding upcoming cardiac CT appointment. °Left message on voicemail with name and callback number ° °Damonte Frieson RN Navigator Cardiac Imaging °[facility] °[phone] Office °[phone] Cell ° °

## 2022-04-28 NOTE — Telephone Encounter (Signed)
Left message to call back  

## 2022-04-28 NOTE — Telephone Encounter (Signed)
Patient states that he calling to speak to the nurse bout his procedure. Please advise

## 2022-04-29 ENCOUNTER — Ambulatory Visit (HOSPITAL_BASED_OUTPATIENT_CLINIC_OR_DEPARTMENT_OTHER): Admission: RE | Admit: 2022-04-29 | Payer: Medicare HMO | Source: Ambulatory Visit

## 2022-04-29 NOTE — Telephone Encounter (Signed)
Patient calling to make [place] aware that his CT will not be until Monday night now. Ablation is scheduled for Wednesday morning. Wanted to make sure that someone would be able to read his CT in time.

## 2022-04-29 NOTE — Telephone Encounter (Signed)
Patient is calling about his CT scan that his having done on Monday night.  He was suppose to have it done today this morning, but they didn't have prior-auth so it got pushed back to Monday.  They told him he could request a fast read, since he is having his ablation on Wednesday.  Please advise.

## 2022-04-30 NOTE — Telephone Encounter (Signed)
Left message to call back  

## 2022-05-01 ENCOUNTER — Telehealth (HOSPITAL_COMMUNITY): Payer: Self-pay | Admitting: *Deleted

## 2022-05-01 NOTE — Telephone Encounter (Signed)
Attempted to call patient regarding upcoming cardiac CT appointment. °Left message on voicemail with name and callback number ° °Daira Hine RN Navigator Cardiac Imaging °[facility] °[phone] Office °[phone] Cell ° °

## 2022-05-04 ENCOUNTER — Telehealth: Payer: Self-pay | Admitting: *Deleted

## 2022-05-04 ENCOUNTER — Ambulatory Visit (HOSPITAL_BASED_OUTPATIENT_CLINIC_OR_DEPARTMENT_OTHER): Admission: RE | Admit: 2022-05-04 | Payer: Medicare HMO | Source: Ambulatory Visit

## 2022-05-04 ENCOUNTER — Encounter: Payer: Self-pay | Admitting: *Deleted

## 2022-05-04 ENCOUNTER — Telehealth: Payer: Self-pay | Admitting: Cardiology

## 2022-05-04 NOTE — Telephone Encounter (Signed)
Pt c/o medication issue:  1. Name of Medication: apixaban (ELIQUIS) 5 MG TABS tablet  2. How are you currently taking this medication (dosage and times per day)? As prescribed   3. Are you having a reaction (difficulty breathing--STAT)? No   4. What is your medication issue? Patient is calling back requesting to speak with Sherri in regards to their previous conversation. He wanted to let her know he missed taking his eliquis Tuesday night 10/17 and Wednesday morning 10/18.

## 2022-05-04 NOTE — Telephone Encounter (Signed)
Left message to call back  (Pt no showed to CT this morning)

## 2022-05-04 NOTE — Telephone Encounter (Signed)
Pt reports he misses last Tuesday, 10/17, evening and the following Wednesday morning of taking his Eliquis. Dr. Curt Bears made aware. No new orders received.

## 2022-05-04 NOTE — Telephone Encounter (Signed)
Pt reports he thought they said 7pm, but informed pt it was for am. Pt confirms he has mostly remained in NSR, and in this currently.  States he usually on goes into afib w/ strenous activity. Pt aware that Dr. Curt Bears said it would be safe to proceed w/ ablation, w/o CT/TEE, with pt being in NSR. Pt aware I will send in updated procedure instructions via mychart. Patient verbalized understanding and agreeable to plan.

## 2022-05-05 NOTE — Pre-Procedure Instructions (Signed)
Instructed patient on the following items: Arrival time 0930 Nothing to eat or drink after midnight No meds AM of procedure Responsible person to drive you home and stay with you for 24 hrs  Have you missed any doses of anti-coagulant Eliquis- missed 2 doses, MD aware

## 2022-05-06 ENCOUNTER — Ambulatory Visit (HOSPITAL_COMMUNITY): Payer: Medicare HMO | Admitting: Certified Registered Nurse Anesthetist

## 2022-05-06 ENCOUNTER — Ambulatory Visit (HOSPITAL_BASED_OUTPATIENT_CLINIC_OR_DEPARTMENT_OTHER): Payer: Medicare HMO | Admitting: Certified Registered Nurse Anesthetist

## 2022-05-06 ENCOUNTER — Encounter (HOSPITAL_COMMUNITY)
Admission: RE | Disposition: A | Discharge status: Home / Self Care | Payer: Self-pay | Source: Home / Self Care | Attending: Cardiology

## 2022-05-06 ENCOUNTER — Other Ambulatory Visit: Payer: Self-pay

## 2022-05-06 ENCOUNTER — Ambulatory Visit (HOSPITAL_COMMUNITY)
Admission: RE | Admit: 2022-05-06 | Discharge: 2022-05-06 | Disposition: A | Discharge status: Home / Self Care | Payer: Medicare HMO | Attending: Cardiology | Admitting: Cardiology

## 2022-05-06 DIAGNOSIS — I4891 Unspecified atrial fibrillation: Secondary | ICD-10-CM

## 2022-05-06 DIAGNOSIS — I480 Paroxysmal atrial fibrillation: Secondary | ICD-10-CM | POA: Insufficient documentation

## 2022-05-06 DIAGNOSIS — Z7901 Long term (current) use of anticoagulants: Secondary | ICD-10-CM | POA: Diagnosis not present

## 2022-05-06 DIAGNOSIS — I483 Typical atrial flutter: Secondary | ICD-10-CM | POA: Diagnosis not present

## 2022-05-06 DIAGNOSIS — I4892 Unspecified atrial flutter: Secondary | ICD-10-CM | POA: Insufficient documentation

## 2022-05-06 DIAGNOSIS — I2510 Atherosclerotic heart disease of native coronary artery without angina pectoris: Secondary | ICD-10-CM | POA: Insufficient documentation

## 2022-05-06 HISTORY — PX: ATRIAL FIBRILLATION ABLATION: EP1191

## 2022-05-06 LAB — POCT ACTIVATED CLOTTING TIME
Activated Clotting Time: 281 seconds
Activated Clotting Time: 335 seconds

## 2022-05-06 SURGERY — ATRIAL FIBRILLATION ABLATION
Anesthesia: General

## 2022-05-06 MED ORDER — PROPOFOL 10 MG/ML IV BOLUS
INTRAVENOUS | Status: DC | PRN
  Administered 2022-05-06: 150 mg via INTRAVENOUS
  Administered 2022-05-06: 30 mg via INTRAVENOUS

## 2022-05-06 MED ORDER — HEPARIN SODIUM (PORCINE) 1000 UNIT/ML IJ SOLN
INTRAMUSCULAR | Status: AC
  Filled 2022-05-06: qty 10

## 2022-05-06 MED ORDER — ONDANSETRON HCL 4 MG/2ML IJ SOLN
INTRAMUSCULAR | Status: DC | PRN
  Administered 2022-05-06: 4 mg via INTRAVENOUS

## 2022-05-06 MED ORDER — FENTANYL CITRATE (PF) 100 MCG/2ML IJ SOLN
INTRAMUSCULAR | Status: DC | PRN
  Administered 2022-05-06: 100 ug via INTRAVENOUS

## 2022-05-06 MED ORDER — HEPARIN (PORCINE) IN NACL 1000-0.9 UT/500ML-% IV SOLN
INTRAVENOUS | Status: AC
  Filled 2022-05-06: qty 500

## 2022-05-06 MED ORDER — FENTANYL CITRATE (PF) 100 MCG/2ML IJ SOLN: 25.0000 ug | INTRAMUSCULAR | Status: DC | PRN

## 2022-05-06 MED ORDER — HEPARIN (PORCINE) IN NACL 1000-0.9 UT/500ML-% IV SOLN
INTRAVENOUS | Status: DC | PRN
  Administered 2022-05-06 (×4): 500 mL

## 2022-05-06 MED ORDER — SODIUM CHLORIDE 0.9 % IV SOLN: INTRAVENOUS | Status: DC

## 2022-05-06 MED ORDER — PHENYLEPHRINE 80 MCG/ML (10ML) SYRINGE FOR IV PUSH (FOR BLOOD PRESSURE SUPPORT)
PREFILLED_SYRINGE | INTRAVENOUS | Status: DC | PRN
  Administered 2022-05-06: 80 ug via INTRAVENOUS

## 2022-05-06 MED ORDER — SODIUM CHLORIDE 0.9 % IV SOLN: 250.0000 mL | INTRAVENOUS | Status: DC | PRN

## 2022-05-06 MED ORDER — SODIUM CHLORIDE 0.9% FLUSH: 3.0000 mL | INTRAVENOUS | Status: DC | PRN

## 2022-05-06 MED ORDER — MIDAZOLAM HCL 2 MG/2ML IJ SOLN
INTRAMUSCULAR | Status: DC | PRN
  Administered 2022-05-06: 2 mg via INTRAVENOUS

## 2022-05-06 MED ORDER — ROCURONIUM BROMIDE 10 MG/ML (PF) SYRINGE
PREFILLED_SYRINGE | INTRAVENOUS | Status: DC | PRN
  Administered 2022-05-06: 60 mg via INTRAVENOUS

## 2022-05-06 MED ORDER — ACETAMINOPHEN 325 MG PO TABS: 650.0000 mg | ORAL_TABLET | ORAL | Status: DC | PRN

## 2022-05-06 MED ORDER — LIDOCAINE 2% (20 MG/ML) 5 ML SYRINGE
INTRAMUSCULAR | Status: DC | PRN
  Administered 2022-05-06: 60 mg via INTRAVENOUS

## 2022-05-06 MED ORDER — SODIUM CHLORIDE 0.9% FLUSH: 3.0000 mL | Freq: Two times a day (BID) | INTRAVENOUS | Status: DC

## 2022-05-06 MED ORDER — ONDANSETRON HCL 4 MG/2ML IJ SOLN: 4.0000 mg | Freq: Once | INTRAMUSCULAR | Status: DC | PRN

## 2022-05-06 MED ORDER — DOBUTAMINE INFUSION FOR EP/ECHO/NUC (1000 MCG/ML)
INTRAVENOUS | Status: DC | PRN
  Administered 2022-05-06: 20 ug/kg/min via INTRAVENOUS

## 2022-05-06 MED ORDER — HEPARIN SODIUM (PORCINE) 1000 UNIT/ML IJ SOLN
INTRAMUSCULAR | Status: DC | PRN
  Administered 2022-05-06: 1000 [IU] via INTRAVENOUS

## 2022-05-06 MED ORDER — OXYCODONE HCL 5 MG PO TABS: 5.0000 mg | ORAL_TABLET | Freq: Once | ORAL | Status: DC | PRN

## 2022-05-06 MED ORDER — OXYCODONE HCL 5 MG/5ML PO SOLN: 5.0000 mg | Freq: Once | ORAL | Status: DC | PRN

## 2022-05-06 MED ORDER — ONDANSETRON HCL 4 MG/2ML IJ SOLN: 4.0000 mg | Freq: Four times a day (QID) | INTRAMUSCULAR | Status: DC | PRN

## 2022-05-06 MED ORDER — DOBUTAMINE INFUSION FOR EP/ECHO/NUC (1000 MCG/ML)
INTRAVENOUS | Status: AC
  Filled 2022-05-06: qty 250

## 2022-05-06 MED ORDER — PROTAMINE SULFATE 10 MG/ML IV SOLN
INTRAVENOUS | Status: DC | PRN
  Administered 2022-05-06: 40 mg via INTRAVENOUS

## 2022-05-06 MED ORDER — EPHEDRINE SULFATE-NACL 50-0.9 MG/10ML-% IV SOSY
PREFILLED_SYRINGE | INTRAVENOUS | Status: DC | PRN
  Administered 2022-05-06: 5 mg via INTRAVENOUS
  Administered 2022-05-06: 2.5 mg via INTRAVENOUS

## 2022-05-06 MED ORDER — SUGAMMADEX SODIUM 200 MG/2ML IV SOLN
INTRAVENOUS | Status: DC | PRN
  Administered 2022-05-06 (×2): 100 mg via INTRAVENOUS

## 2022-05-06 MED ORDER — HEPARIN SODIUM (PORCINE) 1000 UNIT/ML IJ SOLN
INTRAMUSCULAR | Status: DC | PRN
  Administered 2022-05-06: 5000 [IU] via INTRAVENOUS
  Administered 2022-05-06: 14000 [IU] via INTRAVENOUS
  Administered 2022-05-06: 2000 [IU] via INTRAVENOUS

## 2022-05-06 MED ORDER — DEXAMETHASONE SODIUM PHOSPHATE 10 MG/ML IJ SOLN
INTRAMUSCULAR | Status: DC | PRN
  Administered 2022-05-06: 10 mg via INTRAVENOUS

## 2022-05-06 MED ORDER — PHENYLEPHRINE HCL-NACL 20-0.9 MG/250ML-% IV SOLN
INTRAVENOUS | Status: DC | PRN
  Administered 2022-05-06: 25 ug/min via INTRAVENOUS

## 2022-05-06 SURGICAL SUPPLY — 20 items
BAG SNAP BAND KOVER 36X36 (MISCELLANEOUS) IMPLANT
CATH 8FR REPROCESSED SOUNDSTAR (CATHETERS) ×1 IMPLANT
CATH 8FR SOUNDSTAR REPROCESSED (CATHETERS) IMPLANT
CATH ABLAT QDOT MICRO BI TC DF (CATHETERS) IMPLANT
CATH OCTARAY 2.0 F 3-3-3-3-3 (CATHETERS) IMPLANT
CATH S-M CIRCA TEMP PROBE (CATHETERS) IMPLANT
CATH WEBSTER BI DIR CS D-F CRV (CATHETERS) IMPLANT
CLOSURE PERCLOSE PROSTYLE (VASCULAR PRODUCTS) IMPLANT
COVER SWIFTLINK CONNECTOR (BAG) ×1 IMPLANT
KIT VERSACROSS STEERABLE D1 (CATHETERS) IMPLANT
PACK EP LATEX FREE (CUSTOM PROCEDURE TRAY) ×1
PACK EP LF (CUSTOM PROCEDURE TRAY) ×1 IMPLANT
PAD DEFIB RADIO PHYSIO CONN (PAD) ×1 IMPLANT
PATCH CARTO3 (PAD) IMPLANT
SHEATH CARTO VIZIGO SM CVD (SHEATH) IMPLANT
SHEATH PINNACLE 7F 10CM (SHEATH) IMPLANT
SHEATH PINNACLE 8F 10CM (SHEATH) IMPLANT
SHEATH PINNACLE 9F 10CM (SHEATH) IMPLANT
SHEATH PROBE COVER 6X72 (BAG) IMPLANT
TUBING SMART ABLATE COOLFLOW (TUBING) IMPLANT

## 2022-05-06 NOTE — H&P (Signed)
Electrophysiology Office Note   Date:  05/06/2022   ID:  [name] [name], DOB 1955/10/07, MRN [id number]  PCP:  Mercy Moore, MD  Cardiologist:  Jens Som Primary Electrophysiologist:  Nga Rabon Jorja Loa, MD    Chief Complaint: AF   History of Present Illness: [name] [name] is a 65 y.o. male who is being seen today for the evaluation of AF at the request of No ref. provider found. Presenting today for electrophysiology evaluation.  He has a history significant for coronary artery disease and atrial fibrillation.  He had a catheterization in 2009 that showed mild to moderate nonobstructive coronary artery disease.  He has been treated medically.  He wore a Zio patch recently due to an episode of presyncope at [employer].  Zio patch showed episodes of atrial fibrillation, atrial flutter, bradycardia with heart rates of 41 bpm.  He was started on Eliquis.  He had occasional bradycardia and posttermination pauses and thus rate control was avoided.  He usually plays basketball, pickleball, golf on a regular basis.  Unfortunately, he has noted that he has been more fatigued while playing sports.  Today, denies symptoms of palpitations, chest pain, shortness of breath, orthopnea, PND, lower extremity edema, claudication, dizziness, presyncope, syncope, bleeding, or neurologic sequela. The patient is tolerating medications without difficulties. Plan ablaiton today.    Past Medical History:  Diagnosis Date   BRADYCARDIA    Coronary artery disease    Hyperlipidemia    Past Surgical History:  Procedure Laterality Date   HERNIA REPAIR     TONSILLECTOMY       Current Facility-Administered Medications  Medication Dose Route Frequency Provider Last Rate Last Admin   0.9 %  sodium chloride infusion   Intravenous Continuous Kelissa Merlin, Andree Coss, MD 50 mL/hr at 05/06/22 1022 Continued from Pre-op at 05/06/22 1022    Allergies:   Patient has no known allergies.   Social History:   The patient  reports that he has never smoked. He has never used smokeless tobacco. He reports current alcohol use.   Family History:  The patient's family history includes Coronary artery disease in his brother; Heart attack in his brother.   ROS:  Please see the history of present illness.   Otherwise, review of systems is positive for none.   All other systems are reviewed and negative.   PHYSICAL EXAM: VS:  BP (!) 142/72   Pulse (!) 34   Temp 97.6 F (36.4 C) (Temporal)   Resp 17   Ht 6\' 2"  (1.88 m)   Wt 90.7 kg   SpO2 97%   BMI 25.68 kg/m  , BMI Body mass index is 25.68 kg/m. GEN: Well nourished, well developed, in no acute distress  HEENT: normal  Neck: no JVD, carotid bruits, or masses Cardiac: RRR; no murmurs, rubs, or gallops,no edema  Respiratory:  clear to auscultation bilaterally, normal work of breathing GI: soft, nontender, nondistended, + BS MS: no deformity or atrophy  Skin: warm and dry Neuro:  Strength and sensation are intact Psych: euthymic mood, full affect  Recent Labs: 11/28/2021: ALT 27 04/23/2022: BUN 13; Creatinine, Ser 0.97; Hemoglobin 14.0; Platelets 220; Potassium 4.9; Sodium 139    Lipid Panel     Component Value Date/Time   CHOL 116 11/28/2021 1125   TRIG 63 11/28/2021 1125   HDL 46 11/28/2021 1125   CHOLHDL 2.5 11/28/2021 1125   CHOLHDL 3 03/02/2013 0907   VLDL 10.6 03/02/2013 0907   LDLCALC 56 11/28/2021 1125  Wt Readings from Last 3 Encounters:  05/06/22 90.7 kg  02/11/22 92.8 kg  01/19/22 94.3 kg      Other studies Reviewed: Additional studies/ records that were reviewed today include: TTE 12/24/21  Review of the above records today demonstrates:   1. Left ventricular ejection fraction, by estimation, is 60 to 65%. The  left ventricle has normal function. The left ventricle has no regional  wall motion abnormalities. Left ventricular diastolic parameters were  normal. The average left ventricular  global longitudinal  strain is -23.2 %. The global longitudinal strain is  normal.   2. Right ventricular systolic function is normal. The right ventricular  size is normal. There is normal pulmonary artery systolic pressure.   3. Left atrial size was mild to moderately dilated.   4. The mitral valve is normal in structure. Trivial mitral valve  regurgitation. No evidence of mitral stenosis.   5. The aortic valve is tricuspid. Aortic valve regurgitation is trivial.  No aortic stenosis is present.   6. Aortic dilatation noted. There is mild dilatation of the ascending  aorta, measuring 43 mm.   7. The inferior vena cava is normal in size with greater than 50%  respiratory variability, suggesting right atrial pressure of 3 mmHg.    ASSESSMENT AND PLAN:  1.  Paroxysmal atrial fibrillation/atrial flutter: [name] [name] has presented today for surgery, with the diagnosis of AF.  The various methods of treatment have been discussed with the patient and family. After consideration of risks, benefits and other options for treatment, the patient has consented to  Procedure(s): Catheter ablation as a surgical intervention .  Risks include but not limited to complete heart block, stroke, esophageal damage, nerve damage, bleeding, vascular damage, tamponade, perforation, MI, and death. The patient's history has been reviewed, patient examined, no change in status, stable for surgery.  I have reviewed the patient's chart and labs.  Questions were answered to the patient's satisfaction.    Darnesha Diloreto Curt Bears, MD 05/06/2022 11:00 AM

## 2022-05-06 NOTE — Transfer of Care (Signed)
Immediate Anesthesia Transfer of Care Note  Patient: [name] [name]  Procedure(s) Performed: ATRIAL FIBRILLATION ABLATION  Patient Location: Cath Lab  Anesthesia Type:General  Level of Consciousness: drowsy and patient cooperative  Airway & Oxygen Therapy: Patient Spontanous Breathing and Patient connected to nasal cannula oxygen  Post-op Assessment: Report given to RN and Post -op Vital signs reviewed and stable  Post vital signs: Reviewed and stable  Last Vitals:  Vitals Value Taken Time  BP 105/67 05/06/22 1359  Temp    Pulse 52 05/06/22 1401  Resp 13 05/06/22 1401  SpO2 96 % 05/06/22 1401  Vitals shown include unvalidated device data.  Last Pain:  Vitals:   05/06/22 0935  TempSrc: Temporal         Complications:  Encounter Notable Events  Notable Event Outcome Phase Comment  Difficult to intubate - unexpected  Intraprocedure Filed from anesthesia note documentation.

## 2022-05-06 NOTE — Anesthesia Preprocedure Evaluation (Addendum)
Anesthesia Evaluation  Patient identified by MRN, date of birth, ID band Patient awake    Reviewed: Allergy & Precautions, NPO status , Patient's Chart, lab work & pertinent test results  History of Anesthesia Complications Negative for: history of anesthetic complications  Airway Mallampati: III  TM Distance: >3 FB Neck ROM: Full    Dental  (+) Dental Advisory Given, Teeth Intact, Implants   Pulmonary neg pulmonary ROS,    Pulmonary exam normal        Cardiovascular + CAD  + dysrhythmias Atrial Fibrillation  Rhythm:Irregular Rate:Bradycardia   '23 TTE - EF 60 to 65%. Left atrial size was mild to moderately dilated. Trivial mitral valve regurgitation. Aortic valve regurgitation is trivial. There is mild dilatation of the ascending aorta, measuring 43 mm.     Neuro/Psych negative neurological ROS  negative psych ROS   GI/Hepatic negative GI ROS, Neg liver ROS,   Endo/Other  negative endocrine ROS  Renal/GU negative Renal ROS     Musculoskeletal negative musculoskeletal ROS (+)   Abdominal   Peds  Hematology  On eliquis    Anesthesia Other Findings   Reproductive/Obstetrics                            Anesthesia Physical Anesthesia Plan  ASA: 3  Anesthesia Plan: General   Post-op Pain Management: Tylenol PO (pre-op)* and Minimal or no pain anticipated   Induction: Intravenous  PONV Risk Score and Plan: 2 and Treatment may vary due to age or medical condition, Ondansetron and Dexamethasone  Airway Management Planned: Oral ETT  Additional Equipment: None  Intra-op Plan:   Post-operative Plan: Extubation in OR  Informed Consent: I have reviewed the patients History and Physical, chart, labs and discussed the procedure including the risks, benefits and alternatives for the proposed anesthesia with the patient or authorized representative who has indicated his/her understanding and  acceptance.     Dental advisory given  Plan Discussed with: CRNA and Anesthesiologist  Anesthesia Plan Comments:        Anesthesia Quick Evaluation

## 2022-05-06 NOTE — Discharge Instructions (Signed)

## 2022-05-06 NOTE — Anesthesia Procedure Notes (Addendum)
Procedure Name: Intubation Date/Time: 05/06/2022 11:54 AM  Performed by: Janene Harvey, CRNAPre-anesthesia Checklist: Patient identified, Emergency Drugs available, Suction available and Patient being monitored Patient Re-evaluated:Patient Re-evaluated prior to induction Oxygen Delivery Method: Circle system utilized Preoxygenation: Pre-oxygenation with 100% oxygen Induction Type: IV induction Ventilation: Mask ventilation without difficulty and Oral airway inserted - appropriate to patient size Laryngoscope Size: Glidescope and 3 Grade View: Grade II Tube type: Oral Tube size: 7.5 mm Number of attempts: 1 Airway Equipment and Method: Stylet and Oral airway Placement Confirmation: ETT inserted through vocal cords under direct vision, positive ETCO2 and breath sounds checked- equal and bilateral Secured at: 22 cm Tube secured with: Tape Dental Injury: Teeth and Oropharynx as per pre-operative assessment and Injury to lip  Difficulty Due To: Difficult Airway- due to anterior larynx Comments: DL with MAC 4, grade IV view, unable to pass ett. Pt easily masked with oral airway/sevo. VSS. Pt with anterior larynx and overbite. DL with glidescope 3, grade II view, ett passed easily. Small upper lip laceration noted on the right post intubation.

## 2022-05-06 NOTE — Anesthesia Postprocedure Evaluation (Signed)
Anesthesia Post Note  Patient: [name] [name]  Procedure(s) Performed: ATRIAL FIBRILLATION ABLATION     Patient location during evaluation: PACU Anesthesia Type: General Level of consciousness: awake and alert Pain management: pain level controlled Vital Signs Assessment: post-procedure vital signs reviewed and stable Respiratory status: spontaneous breathing, nonlabored ventilation and respiratory function stable Cardiovascular status: stable and blood pressure returned to baseline Anesthetic complications: yes   Encounter Notable Events  Notable Event Outcome Phase Comment  Difficult to intubate - unexpected  Intraprocedure Filed from anesthesia note documentation.    Last Vitals:  Vitals:   05/06/22 1700 05/06/22 1749  BP: (!) 91/59 (!) 92/59  Pulse: (!) 46   Resp: 18   Temp:    SpO2: 95%     Last Pain:  Vitals:   05/06/22 1448  TempSrc:   PainSc: 0-No pain                 Audry Pili

## 2022-05-06 NOTE — Progress Notes (Signed)
D/c instructions given both written and verbal to patient and wife. All questions and concerns addressed.

## 2022-05-07 ENCOUNTER — Encounter (HOSPITAL_COMMUNITY): Payer: Self-pay | Admitting: Cardiology

## 2022-05-19 ENCOUNTER — Other Ambulatory Visit: Payer: Self-pay | Admitting: Cardiology

## 2022-05-19 DIAGNOSIS — E785 Hyperlipidemia, unspecified: Secondary | ICD-10-CM

## 2022-05-24 ENCOUNTER — Other Ambulatory Visit: Payer: Self-pay | Admitting: Cardiology

## 2022-05-25 NOTE — Telephone Encounter (Signed)
Prescription refill request for Eliquis received. Indication:afib Last office visit:8/23 Scr:0.9 Age: 66 Weight:90.7 kg  Prescription refilled

## 2022-06-03 ENCOUNTER — Ambulatory Visit (HOSPITAL_COMMUNITY)
Admission: RE | Admit: 2022-06-03 | Discharge: 2022-06-03 | Disposition: A | Discharge status: Home / Self Care | Payer: Medicare HMO | Source: Ambulatory Visit | Attending: Physician Assistant | Admitting: Physician Assistant

## 2022-06-03 ENCOUNTER — Encounter (HOSPITAL_COMMUNITY): Payer: Self-pay | Admitting: Physician Assistant

## 2022-06-03 VITALS — BP 126/82 | HR 52 | Ht 74.0 in | Wt 203.0 lb

## 2022-06-03 DIAGNOSIS — Z79899 Other long term (current) drug therapy: Secondary | ICD-10-CM | POA: Diagnosis not present

## 2022-06-03 DIAGNOSIS — I2510 Atherosclerotic heart disease of native coronary artery without angina pectoris: Secondary | ICD-10-CM | POA: Diagnosis not present

## 2022-06-03 DIAGNOSIS — Z7901 Long term (current) use of anticoagulants: Secondary | ICD-10-CM | POA: Diagnosis not present

## 2022-06-03 DIAGNOSIS — I4892 Unspecified atrial flutter: Secondary | ICD-10-CM | POA: Diagnosis present

## 2022-06-03 DIAGNOSIS — E785 Hyperlipidemia, unspecified: Secondary | ICD-10-CM | POA: Diagnosis not present

## 2022-06-03 DIAGNOSIS — I480 Paroxysmal atrial fibrillation: Secondary | ICD-10-CM | POA: Insufficient documentation

## 2022-06-03 DIAGNOSIS — D6869 Other thrombophilia: Secondary | ICD-10-CM | POA: Diagnosis not present

## 2022-06-03 NOTE — Progress Notes (Signed)
Primary Care Physician: Mercy Moore, MD Primary Cardiologist: Dr Jens Som  Primary Electrophysiologist: Dr Elberta Fortis Referring Physician: Dr Elberta Fortis   [name] [name] is a 66 y.o. male with a history of CAD, HLD, atrial fibrillation who presents for consultation in the [name]. Patient was diagnosed with afib 11/2021 on a cardiac monitor placed by his PCP. Patient is on Eliquis for a CHADS2VASC score of 2. He was seen by Dr Elberta Fortis and started on amiodarone as a bridge to ablation. He underwent afib and flutter ablation with Dr Elberta Fortis on 05/06/22.   On follow up today, patient reports that he has done well since the procedure with no interim afib episodes. He denies chest pain, swallowing pain, or groin issues. No bleeding issues on anticoagulation.   Today, he denies symptoms of palpitations, chest pain, shortness of breath, orthopnea, PND, lower extremity edema, dizziness, presyncope, syncope, snoring, daytime somnolence, bleeding, or neurologic sequela. The patient is tolerating medications without difficulties and is otherwise without complaint today.    Atrial Fibrillation Risk Factors:  he does not have symptoms or diagnosis of sleep apnea. he does not have a history of rheumatic fever.   he has a BMI of Body mass index is 26.06 kg/m.Marland Kitchen Filed Weights   06/03/22 1431  Weight: 92.1 kg    Family History  Problem Relation Age of Onset   Coronary artery disease Brother    Heart attack Brother      Atrial Fibrillation Management history:  Previous antiarrhythmic drugs: amiodarone  Previous cardioversions: none Previous ablations: 05/06/22 CHADS2VASC score: 2 Anticoagulation history: Eliquis   Past Medical History:  Diagnosis Date   BRADYCARDIA    Coronary artery disease    Hyperlipidemia    Past Surgical History:  Procedure Laterality Date   ATRIAL FIBRILLATION ABLATION N/A 05/06/2022   Procedure: ATRIAL FIBRILLATION ABLATION;   Surgeon: Regan Lemming, MD;  Location: [facility];  Service: Cardiovascular;  Laterality: N/A;   HERNIA REPAIR     TONSILLECTOMY      Current Outpatient Medications  Medication Sig Dispense Refill   amiodarone (PACERONE) 200 MG tablet Take 1 tablet TWICE daily for one month, then take 1 tablet ONCE daily 90 tablet 3   apixaban (ELIQUIS) 5 MG TABS tablet Take 1 tablet by mouth twice daily 60 tablet 5   atorvastatin (LIPITOR) 80 MG tablet Take 1 tablet by mouth once daily 90 tablet 0   cefdinir (OMNICEF) 300 MG capsule Take 1 capsule every 12 hours by oral route for 7 days.     No current facility-administered medications for this encounter.    No Known Allergies  Social History   Socioeconomic History   Marital status: Married    Spouse name: Not on file   Number of children: Not on file   Years of education: Not on file   Highest education level: Not on file  Occupational History   Not on file  Tobacco Use   Smoking status: Never   Smokeless tobacco: Never   Tobacco comments:    Never smoke 06/03/22  Substance and Sexual Activity   Alcohol use: Yes    Alcohol/week: 2.0 standard drinks of alcohol    Types: 2 Standard drinks or equivalent per week    Comment: 2  drink a week 06/03/22   Drug use: Not Currently   Sexual activity: Not on file  Other Topics Concern   Not on file  Social History Narrative   Not on  file   Social Determinants of Health   Financial Resource Strain: Not on file  Food Insecurity: Not on file  Transportation Needs: Not on file  Physical Activity: Not on file  Stress: Not on file  Social Connections: Not on file  Intimate Partner Violence: Not on file     ROS- All systems are reviewed and negative except as per the HPI above.  Physical Exam: Vitals:   06/03/22 1431  BP: 126/82  Pulse: (!) 52  Weight: 92.1 kg  Height: 6\' 2"  (1.88 m)    GEN- The patient is a well appearing male, alert and oriented x 3 today.   Head-  normocephalic, atraumatic Eyes-  Sclera clear, conjunctiva pink Ears- hearing intact Oropharynx- clear Neck- supple  Lungs- Clear to ausculation bilaterally, normal work of breathing Heart- Regular rate and rhythm, no murmurs, rubs or gallops  GI- soft, NT, ND, + BS Extremities- no clubbing, cyanosis, or edema MS- no significant deformity or atrophy Skin- no rash or lesion Psych- euthymic mood, full affect Neuro- strength and sensation are intact  Wt Readings from Last 3 Encounters:  06/03/22 92.1 kg  05/06/22 90.7 kg  02/11/22 92.8 kg    EKG today demonstrates  SB, 1st degree AV block Vent. rate 52 BPM PR interval 206 ms QRS duration 106 ms QT/QTcB 490/455 ms  Echo 12/23/21 demonstrated   1. Left ventricular ejection fraction, by estimation, is 60 to 65%. The  left ventricle has normal function. The left ventricle has no regional  wall motion abnormalities. Left ventricular diastolic parameters were  normal. The average left ventricular global longitudinal strain is -23.2 %. The global longitudinal strain is normal.   2. Right ventricular systolic function is normal. The right ventricular  size is normal. There is normal pulmonary artery systolic pressure.   3. Left atrial size was mild to moderately dilated.   4. The mitral valve is normal in structure. Trivial mitral valve  regurgitation. No evidence of mitral stenosis.   5. The aortic valve is tricuspid. Aortic valve regurgitation is trivial.  No aortic stenosis is present.   6. Aortic dilatation noted. There is mild dilatation of the ascending  aorta, measuring 43 mm.   7. The inferior vena cava is normal in size with greater than 50%  respiratory variability, suggesting right atrial pressure of 3 mmHg.   Comparison(s): Prior images reviewed side by side.   Conclusion(s)/Recommendation(s): Otherwise normal echocardiogram, with  minor abnormalities described in the report. Mild dilation of ascending  aorta (43 mm),  prior 40 mm by echo in 2021.   Epic records are reviewed at length today  CHA2DS2-VASc Score = 2  The patient's score is based upon: CHF History: 0 HTN History: 0 Diabetes History: 0 Stroke History: 0 Vascular Disease History: 1 Age Score: 1 Gender Score: 0       ASSESSMENT AND PLAN: 1. Paroxysmal Atrial Fibrillation/atrial flutter The patient's CHA2DS2-VASc score is 2, indicating a 2.2% annual risk of stroke.   S/p afib and flutter ablation 05/06/22 Patient appears to be maintaining SR. Continue Eliquis 5 mg BID with no missed doses for 3 months post ablation. Continue amiodarone 200 mg daily for now, anticipate this will be short term.  2. Secondary Hypercoagulable State (ICD10:  D68.69) The patient is at significant risk for stroke/thromboembolism based upon his CHA2DS2-VASc Score of 2.  Continue Apixaban (Eliquis).   3. CAD On statin No anginal symptoms.   Follow up with Dr Elberta Fortis as scheduled.  [facility] [address] [place], [place] [address] [phone] 06/03/2022 2:38 PM

## 2022-08-10 ENCOUNTER — Ambulatory Visit: Payer: Medicare HMO | Attending: Cardiology | Admitting: Cardiology

## 2022-08-10 ENCOUNTER — Encounter: Payer: Self-pay | Admitting: Cardiology

## 2022-08-10 VITALS — BP 132/80 | HR 48 | Ht 74.0 in | Wt 200.0 lb

## 2022-08-10 DIAGNOSIS — I480 Paroxysmal atrial fibrillation: Secondary | ICD-10-CM | POA: Diagnosis not present

## 2022-08-10 DIAGNOSIS — I2510 Atherosclerotic heart disease of native coronary artery without angina pectoris: Secondary | ICD-10-CM | POA: Diagnosis not present

## 2022-08-10 DIAGNOSIS — D6869 Other thrombophilia: Secondary | ICD-10-CM

## 2022-08-10 NOTE — Patient Instructions (Signed)
Medication Instructions:  Your physician has recommended you make the following change in your medication:  STOP Amiodarone  *If you need a refill on your cardiac medications before your next appointment, please call your pharmacy*   Lab Work: None ordered   Testing/Procedures: None ordered   Follow-Up: At [facility], you and your health needs are our priority.  As part of our continuing mission to provide you with exceptional heart care, we have created designated Provider Care Teams.  These Care Teams include your primary Cardiologist (physician) and Advanced Practice Providers (APPs -  Physician Assistants and Nurse Practitioners) who all work together to provide you with the care you need, when you need it.  Your next appointment:   6 month(s)  The format for your next appointment:   In Person  Provider:   Will Camnitz, MD    Thank you for choosing [facility]!!   Bahar Shelden, RN [phone]      

## 2022-08-10 NOTE — Progress Notes (Signed)
Electrophysiology Office Note   Date:  08/10/2022   ID:  [name] [name], DOB [birth year], MRN [id number]  PCP:  Sherrie Sport, MD  Cardiologist:  Stanford Breed Primary Electrophysiologist:  Sharalee Witman Meredith Leeds, MD    Chief Complaint: AF   History of Present Illness: [name] [name] is a 67 y.o. male who is being seen today for the evaluation of AF at the request of Sherrie Sport, MD. Presenting today for electrophysiology evaluation.  He has a history significant for coronary artery disease and atrial fibrillation.  He had a catheterization 2009 that showed mild to moderate nonobstructive disease.  He has been treated medically.  He wore a Zio patch that showed due to an episode of presyncope and was found to have atrial fibrillation and flutter with bradycardia.  He is now status post ablation 05/06/2022.  Today, denies symptoms of palpitations, chest pain, shortness of breath, orthopnea, PND, lower extremity edema, claudication, dizziness, presyncope, syncope, bleeding, or neurologic sequela. The patient is tolerating medications without difficulties.  His ablation he has done well.  He has noted no further episodes of atrial fibrillation.  He is back to exercising, playing 3 on 3 basketball.   Past Medical History:  Diagnosis Date   BRADYCARDIA    Coronary artery disease    Hyperlipidemia    Past Surgical History:  Procedure Laterality Date   ATRIAL FIBRILLATION ABLATION N/A 05/06/2022   Procedure: ATRIAL FIBRILLATION ABLATION;  Surgeon: Constance Haw, MD;  Location: [facility];  Service: Cardiovascular;  Laterality: N/A;   HERNIA REPAIR     TONSILLECTOMY       Current Outpatient Medications  Medication Sig Dispense Refill   amiodarone (PACERONE) 200 MG tablet Take 1 tablet TWICE daily for one month, then take 1 tablet ONCE daily 90 tablet 3   apixaban (ELIQUIS) 5 MG TABS tablet Take 1 tablet by mouth twice daily 60 tablet 5   atorvastatin (LIPITOR) 80 MG tablet  Take 1 tablet by mouth once daily 90 tablet 0   cefdinir (OMNICEF) 300 MG capsule Take 1 capsule every 12 hours by oral route for 7 days.     No current facility-administered medications for this visit.    Allergies:   Patient has no known allergies.   Social History:  The patient  reports that he has never smoked. He has never used smokeless tobacco. He reports current alcohol use of about 2.0 standard drinks of alcohol per week. He reports that he does not currently use drugs.   Family History:  The patient's family history includes Coronary artery disease in his brother; Heart attack in his brother.   ROS:  Please see the history of present illness.   Otherwise, review of systems is positive for none.   All other systems are reviewed and negative.   PHYSICAL EXAM: VS:  BP 132/80   Pulse (!) 48   Ht 6\' 2"  (1.88 m)   Wt 200 lb (90.7 kg)   SpO2 97%   BMI 25.68 kg/m  , BMI Body mass index is 25.68 kg/m. GEN: Well nourished, well developed, in no acute distress  HEENT: normal  Neck: no JVD, carotid bruits, or masses Cardiac: RRR; no murmurs, rubs, or gallops,no edema  Respiratory:  clear to auscultation bilaterally, normal work of breathing GI: soft, nontender, nondistended, + BS MS: no deformity or atrophy  Skin: warm and dry Neuro:  Strength and sensation are intact Psych: euthymic mood, full affect  EKG:  EKG is ordered today.  Personal review of the ekg ordered shows sinus rhythm   Recent Labs: 11/28/2021: ALT 27 04/23/2022: BUN 13; Creatinine, Ser 0.97; Hemoglobin 14.0; Platelets 220; Potassium 4.9; Sodium 139    Lipid Panel     Component Value Date/Time   CHOL 116 11/28/2021 1125   TRIG 63 11/28/2021 1125   HDL 46 11/28/2021 1125   CHOLHDL 2.5 11/28/2021 1125   CHOLHDL 3 03/02/2013 0907   VLDL 10.6 03/02/2013 0907   LDLCALC 56 11/28/2021 1125     Wt Readings from Last 3 Encounters:  08/10/22 200 lb (90.7 kg)  06/03/22 203 lb (92.1 kg)  05/06/22 200 lb (90.7  kg)      Other studies Reviewed: Additional studies/ records that were reviewed today include: TTE 12/24/21  Review of the above records today demonstrates:   1. Left ventricular ejection fraction, by estimation, is 60 to 65%. The  left ventricle has normal function. The left ventricle has no regional  wall motion abnormalities. Left ventricular diastolic parameters were  normal. The average left ventricular  global longitudinal strain is -23.2 %. The global longitudinal strain is  normal.   2. Right ventricular systolic function is normal. The right ventricular  size is normal. There is normal pulmonary artery systolic pressure.   3. Left atrial size was mild to moderately dilated.   4. The mitral valve is normal in structure. Trivial mitral valve  regurgitation. No evidence of mitral stenosis.   5. The aortic valve is tricuspid. Aortic valve regurgitation is trivial.  No aortic stenosis is present.   6. Aortic dilatation noted. There is mild dilatation of the ascending  aorta, measuring 43 mm.   7. The inferior vena cava is normal in size with greater than 50%  respiratory variability, suggesting right atrial pressure of 3 mmHg.    ASSESSMENT AND PLAN:  1.  Paroxysmal atrial fibrillation/flutter: Currently on amiodarone and Eliquis, doses above.  CHA2DS2-VASc of 2.  He is status post ablation 05/06/2022.  He has had no further episodes of atrial fibrillation.  Daysi Boggan plan to stop amiodarone today.  2.  Coronary artery disease: Moderate disease in 2009.  Continue management per primary cardiology  3.  Second hypercoagulable state: Currently on Eliquis for atrial fibrillation as above   Current medicines are reviewed at length with the patient today.   The patient does not have concerns regarding his medicines.  The following changes were made today:  none  Labs/ tests ordered today include:  Orders Placed This Encounter  Procedures   EKG 12-Lead     Disposition:   FU 6  months  Signed, Maryuri Warnke Meredith Leeds, MD  08/10/2022 2:47 PM     [facility] [address] [place] [address] [phone] (office) [phone] (fax)

## 2022-10-19 ENCOUNTER — Emergency Department (HOSPITAL_BASED_OUTPATIENT_CLINIC_OR_DEPARTMENT_OTHER): Payer: Medicare HMO | Admitting: Radiology

## 2022-10-19 ENCOUNTER — Encounter (HOSPITAL_BASED_OUTPATIENT_CLINIC_OR_DEPARTMENT_OTHER): Payer: Self-pay | Admitting: Emergency Medicine

## 2022-10-19 ENCOUNTER — Other Ambulatory Visit: Payer: Self-pay

## 2022-10-19 ENCOUNTER — Emergency Department (HOSPITAL_BASED_OUTPATIENT_CLINIC_OR_DEPARTMENT_OTHER)
Admission: EM | Admit: 2022-10-19 | Discharge: 2022-10-19 | Discharge status: Against Medical Advice | Payer: Medicare HMO | Attending: Emergency Medicine | Admitting: Emergency Medicine

## 2022-10-19 ENCOUNTER — Telehealth: Payer: Self-pay | Admitting: Cardiology

## 2022-10-19 DIAGNOSIS — R6884 Jaw pain: Secondary | ICD-10-CM

## 2022-10-19 DIAGNOSIS — Z7901 Long term (current) use of anticoagulants: Secondary | ICD-10-CM | POA: Diagnosis not present

## 2022-10-19 DIAGNOSIS — Z5329 Procedure and treatment not carried out because of patient's decision for other reasons: Secondary | ICD-10-CM | POA: Insufficient documentation

## 2022-10-19 LAB — BASIC METABOLIC PANEL
Anion gap: 8 (ref 5–15)
BUN: 14 mg/dL (ref 8–23)
CO2: 27 mmol/L (ref 22–32)
Calcium: 9.2 mg/dL (ref 8.9–10.3)
Chloride: 104 mmol/L (ref 98–111)
Creatinine, Ser: 0.82 mg/dL (ref 0.61–1.24)
GFR, Estimated: >60 mL/min (ref >60)
Glucose, Bld: 95 mg/dL (ref 70–99)
Potassium: 4.1 mmol/L (ref 3.5–5.1)
Sodium: 139 mmol/L (ref 135–145)

## 2022-10-19 LAB — TROPONIN I (HIGH SENSITIVITY)
Troponin I (High Sensitivity): 11 ng/L (ref <18)
Troponin I (High Sensitivity): 10 ng/L (ref <18)

## 2022-10-19 LAB — CBC
HCT: 42.8 % (ref 39.0–52.0)
Hemoglobin: 14.6 g/dL (ref 13.0–17.0)
MCH: 30 pg (ref 26.0–34.0)
MCHC: 34.1 g/dL (ref 30.0–36.0)
MCV: 88.1 fL (ref 80.0–100.0)
Platelets: 221 10*3/uL (ref 150–400)
RBC: 4.86 MIL/uL (ref 4.22–5.81)
RDW: 13.2 % (ref 11.5–15.5)
WBC: 7.9 10*3/uL (ref 4.0–10.5)
nRBC: 0 % (ref 0.0–0.2)

## 2022-10-19 NOTE — Telephone Encounter (Signed)
Spoke with patient and he states having jaw pain going on over a week.  States it is more at night and it has woken him up lately due to the pain.  States today he had chest discomfort and "felt something that is not normal". He spoke with his dentist who states it sounds more cardiac related than dental issues. He then called [place]. Advised since end of day and he is feeling discomfort in his chest at this time, he should go to ED to be evaluated.  He will go as he heads home to be evalutated.

## 2022-10-19 NOTE — ED Triage Notes (Signed)
Jaw pain/tooth pain at night, not during day. X 1 week Some chest pain

## 2022-10-19 NOTE — Discharge Instructions (Signed)
You did not finish your work up today.  Call your cardiologist tomorrow to schedule to be seen

## 2022-10-19 NOTE — Telephone Encounter (Signed)
Pt c/o of Chest Pain: STAT if CP now or developed within 24 hours  1. Are you having CP right now? No, pt more concerned about jaw pain but states he has slight cp every now and then   2. Are you experiencing any other symptoms (ex. SOB, nausea, vomiting, sweating)? No   3. How long have you been experiencing CP? 2 weeks for jaw pain   4. Is your CP continuous or coming and going? Jaw pain Coming and going at night, does not occur during the day   5. Have you taken Nitroglycerin? No  ?

## 2022-10-20 ENCOUNTER — Telehealth: Payer: Self-pay | Admitting: Cardiology

## 2022-10-20 MED ORDER — NITROGLYCERIN 0.4 MG SL SUBL: 0.4000 mg | SUBLINGUAL_TABLET | SUBLINGUAL | 3 refills | Status: AC | PRN

## 2022-10-20 NOTE — Telephone Encounter (Signed)
*  STAT* If patient is at the pharmacy, call can be transferred to refill team.   1. Which medications need to be refilled? (please list name of each medication and dose if known) Nitroglycerin  2. Which pharmacy/location (including street and city if local pharmacy) is medication to be sent to? [facility] [address] [place], [place] - [address]   3. Do they need a 30 day or 90 day supply?   Standard supply  Patient would like to know if Dr. Clifton James can prescribe for emergencies. He mentions that his daughter in law is a Engineer, civil (consulting) and she recommended having it just to be prepared since he has been having chest pain.

## 2022-10-20 NOTE — Telephone Encounter (Signed)
Spoke with [name] [name] regarding his recent chest and jaw pain. [name] [name] was instructed by our triage to go to the ED yesterday, he did go but after being there for over 3 hours he had to leave to help take care of his 67yo mom. EKG was done, labs were drawn (negative troponins) and [name] [name] had a chest x-ray that were unremarkable. [name] [name] states something just isn't right. He is waking up in the middle of the night with jaw pain that goes away and doesn't bother him during the day. He did have an office visit with his dentist that did not find any issues that would explain the pain he is experiencing. [name] [name] is have chest pain during the day that comes and goes. [name] [name] does explain that in stressful situations his pain can come on. [name] [name] mentions his strong family of heart disease. [name] [name] also expresses concern because several people that he knows have needed bypass surgery, etc and he does not want this to be the case for him. [name] [name] states he had a cath several years ago and was told that he does have areas of stenosis up to 50-60% (per the [name] [name]). Advised [name] [name] that we need to get him in to be seen. I was able to find a DOD slot on Thursday with Dr. Clifton James. [name] [name] states that if he can, he would like to speak to Dr. Jens Som regarding these matters. Explained that at office visit it would be determined if diagnostic testing would be appropriate. [name] [name] verbalizes understanding.

## 2022-10-20 NOTE — Telephone Encounter (Signed)
Spoke with pt, Aware of dr crenshaw's recommendations. New script sent to the pharmacy  

## 2022-10-20 NOTE — Telephone Encounter (Signed)
Spoke with pt, aware dr Jens Som has reviewed all the hospital records and everything looked good. He is going to keep the appointment Thursday. We discussed the use of NTG with the patient.

## 2022-10-20 NOTE — ED Provider Notes (Signed)
[facility] EMERGENCY DEPARTMENT AT [facility] Provider Note   CSN: [id number] Arrival date & time: 10/19/22  1738     History  Chief Complaint  Patient presents with   Jaw Pain    [name] [name] is a 67 y.o. male.  Patient reports he has have been having pain to his right jaw for the past week.  Patient reports the pain only occurs at night.  Patient reports that he spoke to his dentist who was concerned that he could be having pain from his heart.  Patient denies any area specifically of dental pain.  Patient reports he noticed the pain when he is lying down.  He has not had any cough he has not had any congestion patient denies any fever or chills he denies any pain in his chest.  Patient reports that he felt like he was experiencing a fullness in his chest today.  Patient called his cardiologist office and was advised to come to the emergency department.  Patient reports he has a known history of coronary artery disease.  Patient reports he had a cardiac cath 10 years ago and was told that he had 50 to 60% blockages.  Patient is concerned that he needs a repeat cath.  The history is provided by the patient. No language interpreter was used.       Home Medications Prior to Admission medications   Medication Sig Start Date End Date Taking? Authorizing Provider  apixaban (ELIQUIS) 5 MG TABS tablet Take 1 tablet by mouth twice daily 05/25/22   Lewayne Bunting, MD  atorvastatin (LIPITOR) 80 MG tablet Take 1 tablet by mouth once daily 05/21/22   Lewayne Bunting, MD  cefdinir (OMNICEF) 300 MG capsule Take 1 capsule every 12 hours by oral route for 7 days. 06/02/22   [provider]      Allergies    Patient has no known allergies.    Review of Systems   Review of Systems  All other systems reviewed and are negative.   Physical Exam Updated Vital Signs BP (!) 145/85   Pulse (!) 49   Temp 97.9 F (36.6 C) (Oral)   Resp 18   SpO2 97%  Physical  Exam Vitals and nursing note reviewed.  Constitutional:      Appearance: He is well-developed.  HENT:     Head: Normocephalic.     Nose: Nose normal.     Mouth/Throat:     Mouth: Mucous membranes are moist.  Eyes:     Conjunctiva/sclera: Conjunctivae normal.  Cardiovascular:     Rate and Rhythm: Normal rate.  Pulmonary:     Effort: Pulmonary effort is normal.  Abdominal:     General: Abdomen is flat. There is no distension.  Musculoskeletal:        General: Normal range of motion.     Cervical back: Normal range of motion.  Skin:    General: Skin is warm.  Neurological:     General: No focal deficit present.     Mental Status: He is alert and oriented to person, place, and time.     ED Results / Procedures / Treatments   Labs (all labs ordered are listed, but only abnormal results are displayed) Labs Reviewed  BASIC METABOLIC PANEL  CBC  TROPONIN I (HIGH SENSITIVITY)  TROPONIN I (HIGH SENSITIVITY)    EKG EKG Interpretation  Date/Time:  Monday October 19 2022 21:08:51 EDT Ventricular Rate:  49 PR Interval:  189 QRS Duration:  117 QT Interval:  506 QTC Calculation: 457 R Axis:   46 Text Interpretation: Sinus bradycardia Nonspecific intraventricular conduction delay Confirmed by Ernie Avena ([id number]) on 10/19/2022 9:50:48 PM Also confirmed by Ernie Avena ([id number]), editor Harmon Pier, Ashwini [phone])  on 10/20/2022 7:51:20 AM  Radiology DG Chest 2 View  Result Date: 10/19/2022 CLINICAL DATA:  Patient with chest pain. EXAM: CHEST - 2 VIEW COMPARISON:  Chest radiograph 12/12/2007. FINDINGS: The heart size and mediastinal contours are within normal limits. Both lungs are clear. The visualized skeletal structures are unremarkable. IMPRESSION: No active cardiopulmonary disease. Electronically Signed   By: Annia Belt M.D.   On: 10/19/2022 18:47    Procedures Procedures    Medications Ordered in ED Medications - No data to display  ED Course/ Medical Decision  Making/ A&P                             Medical Decision Making Patient complains of pain in his right jaw and right lower teeth.  Right lower teeth patient spoke to his dentist who was concerned that he was having cardiac pain.  Patient called cardiology who advised him to come to the emergency department  Amount and/or Complexity of Data Reviewed External Data Reviewed: notes.    Details: Primary care notes reviewed Labs: ordered.    Details: Labs ordered reviewed and interpreted patient's first troponin is negative Radiology: ordered and independent interpretation performed. Decision-making details documented in ED Course.    Details: X-ray chest ordered reviewed and interpreted chest x-ray shows no acute disease  Risk Risk Details: Patient chose to leave the emergency department before the return of his second troponin.  He left the emergency department AGAINST MEDICAL ADVICE.  Fortunately patient's second troponin did return and was negative.  Patient will follow-up with his cardiologist.           Final Clinical Impression(s) / ED Diagnoses Final diagnoses:  Pain in lower jaw    Rx / DC Orders ED Discharge Orders     None      ama   Osie Cheeks 10/20/22 1419    Ernie Avena, MD 10/20/22 1454

## 2022-10-20 NOTE — Telephone Encounter (Signed)
Pt called in asking to speak to RN about these symptoms. Pt presented to the ER but was not evaluated.

## 2022-10-22 ENCOUNTER — Encounter: Payer: Self-pay | Admitting: Cardiovascular Disease

## 2022-10-22 ENCOUNTER — Ambulatory Visit: Payer: Medicare HMO | Attending: Cardiovascular Disease | Admitting: Cardiovascular Disease

## 2022-10-22 VITALS — BP 138/82 | HR 53 | Ht 74.0 in | Wt 208.8 lb

## 2022-10-22 DIAGNOSIS — I25110 Atherosclerotic heart disease of native coronary artery with unstable angina pectoris: Secondary | ICD-10-CM

## 2022-10-22 DIAGNOSIS — Z01812 Encounter for preprocedural laboratory examination: Secondary | ICD-10-CM | POA: Diagnosis not present

## 2022-10-22 NOTE — H&P (View-Only) (Signed)
Chief Complaint  Patient presents with   Follow-up    CAD    History of Present Illness: 67 yo male with history of CAD, atrial fibrillation, thoracic aortic aneurysm and hyperlipidemia who is here today for cardiac follow up. He is followed in our office by Dr. Jens Som and in our EP clinic by Dr. Raul Del. Cardiac cath in 2009 with mild to moderate CAD. Nuclear stress test in February 2021 with no ischemia. Chest CTA in July 2022 with 4.2 cm ascending aortic aneurysm. Cardiac monitor in 2023 with atrial fibrillation. He underwent atrial fib ablation in October 2023. Echo June 2023 with LVEF=60-65%. No significant valve disease. He was seen in the ED at [facility] 10/19/22 with jaw pain. Troponin negative x 2. EKG without ischemic changes.   He tells me today that he has had pain in several of his teeth. This is not associated with exercise. He has been having chest pain and rest and with exertion. There is associated dyspnea with exertion. He tells me today that he needs to have a heart cath. He feels that his blockages have gotten worse. He does not have the same energy level over the past few months.   Primary Care Physician: Mercy Moore, MD  [facility] Cardiology: Britt Bottom EP: [place]  Past Medical History:  Diagnosis Date   BRADYCARDIA    Coronary artery disease    Hyperlipidemia    PAF (paroxysmal atrial fibrillation)     Past Surgical History:  Procedure Laterality Date   ATRIAL FIBRILLATION ABLATION N/A 05/06/2022   Procedure: ATRIAL FIBRILLATION ABLATION;  Surgeon: Regan Lemming, MD;  Location: [facility];  Service: Cardiovascular;  Laterality: N/A;   HERNIA REPAIR     TONSILLECTOMY      Current Outpatient Medications  Medication Sig Dispense Refill   apixaban (ELIQUIS) 5 MG TABS tablet Take 1 tablet by mouth twice daily 60 tablet 5   atorvastatin (LIPITOR) 80 MG tablet Take 1 tablet by mouth once daily 90 tablet 0   fluticasone (FLONASE) 50 MCG/ACT nasal  spray Place 2 sprays into both nostrils daily.     nitroGLYCERIN (NITROSTAT) 0.4 MG SL tablet Place 1 tablet (0.4 mg total) under the tongue every 5 (five) minutes as needed for chest pain. 25 tablet 3   No current facility-administered medications for this visit.    No Known Allergies  Social History   Socioeconomic History   Marital status: Married    Spouse name: Not on file   Number of children: Not on file   Years of education: Not on file   Highest education level: Not on file  Occupational History   Not on file  Tobacco Use   Smoking status: Never   Smokeless tobacco: Never   Tobacco comments:    Never smoke 06/03/22  Substance and Sexual Activity   Alcohol use: Yes    Alcohol/week: 2.0 standard drinks of alcohol    Types: 2 Standard drinks or equivalent per week    Comment: 2  drink a week 06/03/22   Drug use: Not Currently   Sexual activity: Not on file  Other Topics Concern   Not on file  Social History Narrative   Not on file   Social Determinants of Health   Financial Resource Strain: Not on file  Food Insecurity: Not on file  Transportation Needs: Not on file  Physical Activity: Not on file  Stress: Not on file  Social Connections: Not on file  Intimate Partner Violence: Not on file    Family History  Problem Relation Age of Onset   Coronary artery disease Brother    Heart attack Brother     Review of Systems:  As stated in the HPI and otherwise negative.   BP 138/82   Pulse (!) 53   Ht 6\' 2"  (1.88 m)   Wt 94.7 kg   SpO2 95%   BMI 26.81 kg/m   Physical Examination: General: Well developed, well nourished, NAD  HEENT: OP clear, mucus membranes moist  SKIN: warm, dry. No rashes. Neuro: No focal deficits  Musculoskeletal: Muscle strength 5/5 all ext  Psychiatric: Mood and affect normal  Neck: No JVD, no carotid bruits, no thyromegaly, no lymphadenopathy.  Lungs:Clear bilaterally, no wheezes, rhonci, crackles Cardiovascular: Regular rate  and rhythm. No murmurs, gallops or rubs. Abdomen:Soft. Bowel sounds present. Non-tender.  Extremities: No lower extremity edema. Pulses are 2 + in the bilateral DP/PT.  EKG:  EKG is ordered today. The ekg ordered today demonstrates sinus bradycardia, rate 53 bpm  Recent Labs: 11/28/2021: ALT 27 10/19/2022: BUN 14; Creatinine, Ser 0.82; Hemoglobin 14.6; Platelets 221; Potassium 4.1; Sodium 139   Lipid Panel    Component Value Date/Time   CHOL 116 11/28/2021 1125   TRIG 63 11/28/2021 1125   HDL 46 11/28/2021 1125   CHOLHDL 2.5 11/28/2021 1125   CHOLHDL 3 03/02/2013 0907   VLDL 10.6 03/02/2013 0907   LDLCALC 56 11/28/2021 1125     Wt Readings from Last 3 Encounters:  10/22/22 94.7 kg  08/10/22 90.7 kg  06/03/22 92.1 kg     Assessment and Plan:   1.  CAD with unstable angina: He is reporting exertional dyspnea and chest pain as well as fatigue. He is adamant today that he needs a cardiac cath. I have discussed arranging a coronary CTA but he does not wish to have this testing modality. [facility] 7:30 am.  I have reviewed the risks, indications, and alternatives to cardiac catheterization, possible angioplasty, and stenting with the patient. Risks include but are not limited to bleeding, infection, vascular injury, stroke, myocardial infection, arrhythmia, kidney injury, radiation-related injury in the case of prolonged fluoroscopy use, emergency cardiac surgery, and death. The patient understands the risks of serious complication is 1-2 in 1000 with diagnostic cardiac cath and 1-2% or less with angioplasty/stenting.  BMET and CBC today Eliquis to be held prior to cath  2. Atrial fib,paroxysmal: Sinus today. Continue Eliquis.   Labs/ tests ordered today include:   Orders Placed This Encounter  Procedures   Basic metabolic panel   CBC   EKG 12-Lead   Disposition:   F/U with Dr. Jens Somrenshaw following his cath   Signed, Verne Carrowhristopher Antwane Grose, MD,  [facility] 10/22/2022 5:15 PM    [facility] [address], [place], [place]  [address] Phone: [phone]; Fax: [phone]

## 2022-10-22 NOTE — Progress Notes (Signed)
Chief Complaint  Patient presents with   Follow-up    CAD    History of Present Illness: 67 yo male with history of CAD, atrial fibrillation, thoracic aortic aneurysm and hyperlipidemia who is here today for cardiac follow up. He is followed in our office by Dr. Jens Som and in our EP clinic by Dr. Raul Del. Cardiac cath in 2009 with mild to moderate CAD. Nuclear stress test in February 2021 with no ischemia. Chest CTA in July 2022 with 4.2 cm ascending aortic aneurysm. Cardiac monitor in 2023 with atrial fibrillation. He underwent atrial fib ablation in October 2023. Echo June 2023 with LVEF=60-65%. No significant valve disease. He was seen in the ED at [facility] on 10/19/22 with jaw pain. Troponin negative x 2. EKG without ischemic changes.   He tells me today that he has had pain in several of his teeth. This is not associated with exercise. He has been having chest pain and rest and with exertion. There is associated dyspnea with exertion. He tells me today that he needs to have a heart cath. He feels that his blockages have gotten worse. He does not have the same energy level over the past few months.   Primary Care Physician: Mercy Moore, MD  [facility] Cardiology: Britt Bottom EP: [place]  Past Medical History:  Diagnosis Date   BRADYCARDIA    Coronary artery disease    Hyperlipidemia    PAF (paroxysmal atrial fibrillation)     Past Surgical History:  Procedure Laterality Date   ATRIAL FIBRILLATION ABLATION N/A 05/06/2022   Procedure: ATRIAL FIBRILLATION ABLATION;  Surgeon: Regan Lemming, MD;  Location: [facility] INVAS[facility];  Service: Cardiovascular;  Laterality: N/A;   HERNIA REPAIR     TONSILLECTOMY      Current Outpatient Medications  Medication Sig Dispense Refill   apixaban (ELIQUIS) 5 MG TABS tablet Take 1 tablet by mouth twice daily 60 tablet 5   atorvastatin (LIPITOR) 80 MG tablet Take 1 tablet by mouth once daily 90 tablet 0   fluticasone (FLONASE) 50 MCG/ACT nasal  spray Place 2 sprays into both nostrils daily.     nitroGLYCERIN (NITROSTAT) 0.4 MG SL tablet Place 1 tablet (0.4 mg total) under the tongue every 5 (five) minutes as needed for chest pain. 25 tablet 3   No current facility-administered medications for this visit.    No Known Allergies  Social History   Socioeconomic History   Marital status: Married    Spouse name: Not on file   Number of children: Not on file   Years of education: Not on file   Highest education level: Not on file  Occupational History   Not on file  Tobacco Use   Smoking status: Never   Smokeless tobacco: Never   Tobacco comments:    Never smoke 06/03/22  Substance and Sexual Activity   Alcohol use: Yes    Alcohol/week: 2.0 standard drinks of alcohol    Types: 2 Standard drinks or equivalent per week    Comment: 2  drink a week 06/03/22   Drug use: Not Currently   Sexual activity: Not on file  Other Topics Concern   Not on file  Social History Narrative   Not on file   Social Determinants of Health   Financial Resource Strain: Not on file  Food Insecurity: Not on file  Transportation Needs: Not on file  Physical Activity: Not on file  Stress: Not on file  Social Connections: Not on file  Intimate Partner Violence: Not on file    Family History  Problem Relation Age of Onset   Coronary artery disease Brother    Heart attack Brother     Review of Systems:  As stated in the HPI and otherwise negative.   BP 138/82   Pulse (!) 53   Ht 6' 2" (1.88 m)   Wt 94.7 kg   SpO2 95%   BMI 26.81 kg/m   Physical Examination: General: Well developed, well nourished, NAD  HEENT: OP clear, mucus membranes moist  SKIN: warm, dry. No rashes. Neuro: No focal deficits  Musculoskeletal: Muscle strength 5/5 all ext  Psychiatric: Mood and affect normal  Neck: No JVD, no carotid bruits, no thyromegaly, no lymphadenopathy.  Lungs:Clear bilaterally, no wheezes, rhonci, crackles Cardiovascular: Regular rate  and rhythm. No murmurs, gallops or rubs. Abdomen:Soft. Bowel sounds present. Non-tender.  Extremities: No lower extremity edema. Pulses are 2 + in the bilateral DP/PT.  EKG:  EKG is ordered today. The ekg ordered today demonstrates sinus bradycardia, rate 53 bpm  Recent Labs: 11/28/2021: ALT 27 10/19/2022: BUN 14; Creatinine, Ser 0.82; Hemoglobin 14.6; Platelets 221; Potassium 4.1; Sodium 139   Lipid Panel    Component Value Date/Time   CHOL 116 11/28/2021 1125   TRIG 63 11/28/2021 1125   HDL 46 11/28/2021 1125   CHOLHDL 2.5 11/28/2021 1125   CHOLHDL 3 03/02/2013 0907   VLDL 10.6 03/02/2013 0907   LDLCALC 56 11/28/2021 1125     Wt Readings from Last 3 Encounters:  10/22/22 94.7 kg  08/10/22 90.7 kg  06/03/22 92.1 kg     Assessment and Plan:   1.  CAD with unstable angina: He is reporting exertional dyspnea and chest pain as well as fatigue. He is adamant today that he needs a cardiac cath. I have discussed arranging a coronary CTA but he does not wish to have this testing modality. Will plan cardiac cath at [facility] 10/29/22 at 7:30 am.  I have reviewed the risks, indications, and alternatives to cardiac catheterization, possible angioplasty, and stenting with the patient. Risks include but are not limited to bleeding, infection, vascular injury, stroke, myocardial infection, arrhythmia, kidney injury, radiation-related injury in the case of prolonged fluoroscopy use, emergency cardiac surgery, and death. The patient understands the risks of serious complication is 1-2 in 1000 with diagnostic cardiac cath and 1-2% or less with angioplasty/stenting.  BMET and CBC today Eliquis to be held prior to cath  2. Atrial fib,paroxysmal: Sinus today. Continue Eliquis.   Labs/ tests ordered today include:   Orders Placed This Encounter  Procedures   Basic metabolic panel   CBC   EKG 12-Lead   Disposition:   F/U with Dr. Crenshaw following his cath   Signed, Heli Dino, MD,  FACC 10/22/2022 5:15 PM    [facility] [address], [place], [place]  [address] Phone: [phone]; Fax: [phone]    

## 2022-10-22 NOTE — Patient Instructions (Signed)
Medication Instructions:  Your physician recommends that you continue on your current medications as directed. Please refer to the Current Medication list given to you today.  *If you need a refill on your cardiac medications before your next appointment, please call your pharmacy*   Lab Work: Today: cbc, bmet If you have labs (blood work) drawn today and your tests are completely normal, you will receive your results only by: MyChart Message (if you have MyChart) OR A paper copy in the mail If you have any lab test that is abnormal or we need to change your treatment, we will call you to review the results.   Testing/Procedures: Your physician has requested that you have a cardiac catheterization. Cardiac catheterization is used to diagnose and/or treat various heart conditions. Doctors may recommend this procedure for a number of different reasons. The most common reason is to evaluate chest pain. Chest pain can be a symptom of coronary artery disease (CAD), and cardiac catheterization can show whether plaque is narrowing or blocking your heart's arteries. This procedure is also used to evaluate the valves, as well as measure the blood flow and oxygen levels in different parts of your heart. For further information please visit [email or web address] Please follow instruction sheet, as given.    Follow-Up: At [facility], you and your health needs are our priority.  As part of our continuing mission to provide you with exceptional heart care, we have created designated Provider Care Teams.  These Care Teams include your primary Cardiologist (physician) and Advanced Practice Providers (APPs -  Physician Assistants and Nurse Practitioners) who all work together to provide you with the care you need, when you need it.   Your next appointment:   4 week(s)  Provider:   Dr. Jens Som or Advanced Practice Provider  Other Instructions       Cardiac/Peripheral  Catheterization   You are scheduled for a Cardiac Catheterization on Thursday, April 18 with Dr. Verne Carrow.  1. Please arrive at the [place] (Main Entrance A) at [facility]: [address] [place], [place] [address] at 5:30 AM (This time is two hours before your procedure to ensure your preparation). Free valet parking service is available. You will check in at ADMITTING. The support person will be asked to wait in the waiting room.  It is OK to have someone drop you off and come back when you are ready to be discharged.        Special note: Every effort is made to have your procedure done on time. Please understand that emergencies sometimes delay scheduled procedures.  2. Diet: Do not eat solid foods after midnight.  You may have clear liquids until 5 AM the day of the procedure.  3. Labs: You will need to have blood drawn today.  You do not need to be fasting.  4. Medication instructions in preparation for your procedure:   Contrast Allergy: No   DO NOT TAKE ELIQUIS AFTER MONDAY EVENING 10/26/22  On the morning of your procedure, take Aspirin 81 mg and any morning medicines NOT listed above.  You may use sips of water.  5. Plan to go home the same day, you will only stay overnight if medically necessary. 6. You MUST have a responsible adult to drive you home. 7. An adult MUST be with you the first 24 hours after you arrive home. 8. Bring a current list of your medications, and the last time and date medication taken. 9. Bring ID  and current insurance cards. 10.Please wear clothes that are easy to get on and off and wear slip-on shoes.  Thank you for allowing [place] to care for you!   -- [facility]

## 2022-10-22 NOTE — Addendum Note (Signed)
Addended by: Verne Carrow D on: 10/22/2022 05:23 PM   Modules accepted: Orders

## 2022-10-23 LAB — CBC
Hematocrit: 42.9 % (ref 37.5–51.0)
Hemoglobin: 14.2 g/dL (ref 13.0–17.7)
MCH: 29.2 pg (ref 26.6–33.0)
MCHC: 33.1 g/dL (ref 31.5–35.7)
MCV: 88 fL (ref 79–97)
Platelets: 212 10*3/uL (ref 150–450)
RBC: 4.86 x10E6/uL (ref 4.14–5.80)
RDW: 12.8 % (ref 11.6–15.4)
WBC: 7.1 10*3/uL (ref 3.4–10.8)

## 2022-10-23 LAB — BASIC METABOLIC PANEL
BUN/Creatinine Ratio: 15 (ref 10–24)
BUN: 14 mg/dL (ref 8–27)
CO2: 25 mmol/L (ref 20–29)
Calcium: 9.4 mg/dL (ref 8.6–10.2)
Chloride: 101 mmol/L (ref 96–106)
Creatinine, Ser: 0.96 mg/dL (ref 0.76–1.27)
Glucose: 93 mg/dL (ref 70–99)
Potassium: 4.1 mmol/L (ref 3.5–5.2)
Sodium: 139 mmol/L (ref 134–144)
eGFR: 87 mL/min/{1.73_m2} (ref >59)

## 2022-10-27 ENCOUNTER — Other Ambulatory Visit: Payer: Self-pay | Admitting: *Deleted

## 2022-10-27 DIAGNOSIS — I480 Paroxysmal atrial fibrillation: Secondary | ICD-10-CM

## 2022-10-27 MED ORDER — APIXABAN 5 MG PO TABS
5.0000 mg | ORAL_TABLET | Freq: Two times a day (BID) | ORAL | 5 refills | Status: DC
Start: 2022-10-27 — End: 2023-05-12

## 2022-10-27 NOTE — Telephone Encounter (Signed)
Eliquis  refill request received. Patient is 67 years old, weight-94.7kg, Crea-0.96 on 10/22/22, Diagnosis-Afib, and last seen by Dr. Clifton James on 10/22/22. Dose is appropriate based on dosing criteria. Will send in refill to requested pharmacy.

## 2022-10-28 ENCOUNTER — Telehealth: Payer: Self-pay | Admitting: *Deleted

## 2022-10-28 NOTE — Telephone Encounter (Signed)
Cardiac Catheterization scheduled at [facility] for: Thursday October 29, 2022 7:30 AM Arrival time [facility] Main Entrance A at: 5:30 AM  Nothing to eat after midnight prior to procedure, clear liquids until 5 AM day of procedure.  Medication instructions: -Hold:  Eliquis-none 10/27/22 until post procedure -Other usual morning medications can be taken with sips of water including aspirin 81 mg.  Confirmed patient has responsible adult to drive home post procedure and be with patient first 24 hours after arriving home.  Plan to go home the same day, you will only stay overnight if medically necessary.  Reviewed procedure instructions with patient.

## 2022-10-29 ENCOUNTER — Encounter (HOSPITAL_COMMUNITY)
Admission: RE | Disposition: A | Discharge status: Home / Self Care | Payer: Self-pay | Source: Home / Self Care | Attending: Cardiovascular Disease

## 2022-10-29 ENCOUNTER — Other Ambulatory Visit: Payer: Self-pay

## 2022-10-29 ENCOUNTER — Ambulatory Visit (HOSPITAL_COMMUNITY)
Admission: RE | Admit: 2022-10-29 | Discharge: 2022-10-29 | Disposition: A | Discharge status: Home / Self Care | Payer: Medicare HMO | Attending: Cardiovascular Disease | Admitting: Cardiovascular Disease

## 2022-10-29 ENCOUNTER — Encounter (HOSPITAL_COMMUNITY): Payer: Self-pay | Admitting: Cardiovascular Disease

## 2022-10-29 DIAGNOSIS — I712 Thoracic aortic aneurysm, without rupture, unspecified: Secondary | ICD-10-CM | POA: Diagnosis not present

## 2022-10-29 DIAGNOSIS — I2584 Coronary atherosclerosis due to calcified coronary lesion: Secondary | ICD-10-CM | POA: Insufficient documentation

## 2022-10-29 DIAGNOSIS — Z7901 Long term (current) use of anticoagulants: Secondary | ICD-10-CM | POA: Insufficient documentation

## 2022-10-29 DIAGNOSIS — I4891 Unspecified atrial fibrillation: Secondary | ICD-10-CM | POA: Insufficient documentation

## 2022-10-29 DIAGNOSIS — E785 Hyperlipidemia, unspecified: Secondary | ICD-10-CM | POA: Insufficient documentation

## 2022-10-29 DIAGNOSIS — I25118 Atherosclerotic heart disease of native coronary artery with other forms of angina pectoris: Secondary | ICD-10-CM | POA: Diagnosis present

## 2022-10-29 HISTORY — PX: LEFT HEART CATH AND CORONARY ANGIOGRAPHY: CATH118249

## 2022-10-29 SURGERY — LEFT HEART CATH AND CORONARY ANGIOGRAPHY
Anesthesia: LOCAL

## 2022-10-29 MED ORDER — VERAPAMIL HCL 2.5 MG/ML IV SOLN
INTRAVENOUS | Status: AC
  Filled 2022-10-29: qty 2

## 2022-10-29 MED ORDER — HEPARIN (PORCINE) IN NACL 1000-0.9 UT/500ML-% IV SOLN
INTRAVENOUS | Status: DC | PRN
  Administered 2022-10-29 (×2): 500 mL

## 2022-10-29 MED ORDER — ASPIRIN 81 MG PO CHEW: 81.0000 mg | CHEWABLE_TABLET | ORAL | Status: DC

## 2022-10-29 MED ORDER — HEPARIN SODIUM (PORCINE) 1000 UNIT/ML IJ SOLN
INTRAMUSCULAR | Status: DC | PRN
  Administered 2022-10-29: 5000 [IU] via INTRAVENOUS

## 2022-10-29 MED ORDER — SODIUM CHLORIDE 0.9% FLUSH: 3.0000 mL | INTRAVENOUS | Status: DC | PRN

## 2022-10-29 MED ORDER — ACETAMINOPHEN 325 MG PO TABS
650.0000 mg | ORAL_TABLET | ORAL | Status: DC | PRN
  Administered 2022-10-29: 650 mg via ORAL
  Filled 2022-10-29: qty 2

## 2022-10-29 MED ORDER — SODIUM CHLORIDE 0.9 % IV SOLN: 250.0000 mL | INTRAVENOUS | Status: DC | PRN

## 2022-10-29 MED ORDER — SODIUM CHLORIDE 0.9% FLUSH: 3.0000 mL | Freq: Two times a day (BID) | INTRAVENOUS | Status: DC

## 2022-10-29 MED ORDER — VERAPAMIL HCL 2.5 MG/ML IV SOLN
INTRAVENOUS | Status: DC | PRN
  Administered 2022-10-29: 10 mL via INTRA_ARTERIAL

## 2022-10-29 MED ORDER — SODIUM CHLORIDE 0.9 % IV SOLN: INTRAVENOUS | Status: DC

## 2022-10-29 MED ORDER — LIDOCAINE HCL (PF) 1 % IJ SOLN
INTRAMUSCULAR | Status: AC
  Filled 2022-10-29: qty 30

## 2022-10-29 MED ORDER — MIDAZOLAM HCL 2 MG/2ML IJ SOLN
INTRAMUSCULAR | Status: AC
  Filled 2022-10-29: qty 2

## 2022-10-29 MED ORDER — SODIUM CHLORIDE 0.9 % IV BOLUS
250.0000 mL | Freq: Once | INTRAVENOUS | Status: AC
  Administered 2022-10-29: 250 mL via INTRAVENOUS

## 2022-10-29 MED ORDER — LIDOCAINE HCL (PF) 1 % IJ SOLN
INTRAMUSCULAR | Status: DC | PRN
  Administered 2022-10-29: 2 mL via INTRADERMAL

## 2022-10-29 MED ORDER — FENTANYL CITRATE (PF) 100 MCG/2ML IJ SOLN
INTRAMUSCULAR | Status: DC | PRN
  Administered 2022-10-29: 50 ug via INTRAVENOUS

## 2022-10-29 MED ORDER — ONDANSETRON HCL 4 MG/2ML IJ SOLN: 4.0000 mg | Freq: Four times a day (QID) | INTRAMUSCULAR | Status: DC | PRN

## 2022-10-29 MED ORDER — FENTANYL CITRATE (PF) 100 MCG/2ML IJ SOLN
INTRAMUSCULAR | Status: AC
  Filled 2022-10-29: qty 2

## 2022-10-29 MED ORDER — SODIUM CHLORIDE 0.9 % WEIGHT BASED INFUSION
3.0000 mL/kg/h | INTRAVENOUS | Status: AC
  Administered 2022-10-29: 3 mL/kg/h via INTRAVENOUS

## 2022-10-29 MED ORDER — SODIUM CHLORIDE 0.9 % WEIGHT BASED INFUSION: 1.0000 mL/kg/h | INTRAVENOUS | Status: DC

## 2022-10-29 MED ORDER — IOHEXOL 350 MG/ML SOLN
INTRAVENOUS | Status: DC | PRN
  Administered 2022-10-29: 50 mL

## 2022-10-29 MED ORDER — MIDAZOLAM HCL 2 MG/2ML IJ SOLN
INTRAMUSCULAR | Status: DC | PRN
  Administered 2022-10-29: 2 mg via INTRAVENOUS

## 2022-10-29 MED ORDER — HYDRALAZINE HCL 20 MG/ML IJ SOLN: 10.0000 mg | INTRAMUSCULAR | Status: DC | PRN

## 2022-10-29 MED ORDER — HEPARIN SODIUM (PORCINE) 1000 UNIT/ML IJ SOLN
INTRAMUSCULAR | Status: AC
  Filled 2022-10-29: qty 10

## 2022-10-29 MED ORDER — LABETALOL HCL 5 MG/ML IV SOLN: 10.0000 mg | INTRAVENOUS | Status: DC | PRN

## 2022-10-29 SURGICAL SUPPLY — 10 items
BAND CMPR LRG ZPHR (HEMOSTASIS) ×1
BAND ZEPHYR COMPRESS 30 LONG (HEMOSTASIS) IMPLANT
CATH 5FR JL3.5 JR4 ANG PIG MP (CATHETERS) IMPLANT
GLIDESHEATH SLEND SS 6F .021 (SHEATH) IMPLANT
GUIDEWIRE INQWIRE 1.5J.035X260 (WIRE) IMPLANT
INQWIRE 1.5J .035X260CM (WIRE) ×1
KIT HEART LEFT (KITS) ×1 IMPLANT
PACK CARDIAC CATHETERIZATION (CUSTOM PROCEDURE TRAY) ×1 IMPLANT
TRANSDUCER W/STOPCOCK (MISCELLANEOUS) ×1 IMPLANT
TUBING CIL FLEX 10 FLL-RA (TUBING) ×1 IMPLANT

## 2022-10-29 NOTE — Discharge Instructions (Signed)
Resume Eliquis tomorrow if no bleeding from right wrist cath site.

## 2022-10-29 NOTE — Progress Notes (Addendum)
Client c/o headache 2/10 behind left eye; Lillia Abed, NP notified and in to see client; Lillia Abed, NP did neuro checks and will give client  tylenol for headache

## 2022-10-29 NOTE — Progress Notes (Addendum)
Client c/o feeling lightheaded and clammy feeling and weak ; lying back in recliner and Laverda Page, NP notified and order noted

## 2022-10-29 NOTE — Interval H&P Note (Signed)
History and Physical Interval Note:  10/29/2022 7:16 AM  [name] [name]  has presented today for surgery, with the diagnosis of chest pain.  The various methods of treatment have been discussed with the patient and family. After consideration of risks, benefits and other options for treatment, the patient has consented to  Procedure(s): LEFT HEART CATH AND CORONARY ANGIOGRAPHY (N/A) as a surgical intervention.  The patient's history has been reviewed, patient examined, no change in status, stable for surgery.  I have reviewed the patient's chart and labs.  Questions were answered to the patient's satisfaction.    Cath Lab Visit (complete for each Cath Lab visit)  Clinical Evaluation Leading to the Procedure:   ACS: No.  Non-ACS:    Anginal Classification: CCS III  Anti-ischemic medical therapy: No Therapy  Non-Invasive Test Results: No non-invasive testing performed  Prior CABG: No previous CABG        Verne Carrow

## 2022-11-17 NOTE — Progress Notes (Unsigned)
Cardiology Clinic Note   Patient Name: Srikar Consolo Date of Encounter: 11/19/2022  Primary Care Provider:  Mercy Moore, MD Primary Cardiologist:  Olga Millers, MD  Patient Profile    Treshon Groesbeck 67 year old male presents the clinic today for follow-up evaluation of his coronary artery disease and paroxysmal atrial fibrillation.  Past Medical History    Past Medical History:  Diagnosis Date   BRADYCARDIA    Coronary artery disease    Hyperlipidemia    PAF (paroxysmal atrial fibrillation) (HCC)    Past Surgical History:  Procedure Laterality Date   ATRIAL FIBRILLATION ABLATION N/A 05/06/2022   Procedure: ATRIAL FIBRILLATION ABLATION;  Surgeon: Regan Lemming, MD;  Location: [facility];  Service: Cardiovascular;  Laterality: N/A;   HERNIA REPAIR     LEFT HEART CATH AND CORONARY ANGIOGRAPHY N/A 10/29/2022   Procedure: LEFT HEART CATH AND CORONARY ANGIOGRAPHY;  Surgeon: Kathleene Hazel, MD;  Location: [facility];  Service: Cardiovascular;  Laterality: N/A;   TONSILLECTOMY      Allergies  No Known Allergies  History of Present Illness    Jaxstyn Stetter is a PMH of coronary artery disease, atrial fibrillation, thoracic aortic aneurysm, and HLD.  He underwent cardiac catheterization in 2009 which showed mild-moderate coronary disease.  His nuclear stress test 2/21 showed no ischemia.  His chest CTA 7/22 showed a 4.2 cm ascending aortic aneurysm.  He wore a cardiac event monitor in 2023 which showed atrial fibrillation.  He underwent A-fib ablation 10/23.  His echocardiogram 6/23 showed an LVEF of 60-65% with no significant valvular disease.  He presented to the emergency department 10/19/2022 with jaw pain.  His troponins were negative x 2.  His EKG showed no ischemic changes.  He was seen precatheterization by Dr. Clifton James and reported severe pain in his teeth.  His pain was not associated with exercise.  He reported chest pain at rest  and with exertion.  He also noted dyspnea with exertion.  He was worried that his coronary blockages had become worse.  He also noted that he did not have the same energy over the past several months.  His cardiac catheterization 10/29/2022 showed mid RCA 30%, distal RCA 50%, RPDA 30%, RP AV 20%, proximal circumflex 20%, proximal LAD-mid LAD 20%, normal LV function, and normal LVEDP.  His symptoms were not felt to be cardiac related.  He presents to the clinic today for follow-up evaluation and states he reports that his teeth pain was related to dental issue.  We reviewed his cardiac catheterization and he expressed understanding.  We reviewed his last lipid panel.  He continues to be compliant with his medications and denies side effects.  His biggest complaint today is with his left knee.  He is planning to see orthopedic for evaluation.  I will have him present for fasting lipids and LFTs in the next 1 to 2 weeks.  We will have him continue his current diet and exercise.  Will plan follow-up in 4 to 6 months.  Today he denies chest pain, shortness of breath, lower extremity edema, fatigue, palpitations, melena, hematuria, hemoptysis, diaphoresis, weakness, presyncope, syncope, orthopnea, and PND.   Home Medications    Prior to Admission medications   Medication Sig Start Date End Date Taking? Authorizing Provider  apixaban (ELIQUIS) 5 MG TABS tablet Take 1 tablet (5 mg total) by mouth 2 (two) times daily. 10/27/22   Lewayne Bunting, MD  atorvastatin (LIPITOR) 80 MG tablet Take 1  tablet by mouth once daily Patient taking differently: Take 80 mg by mouth every evening. 05/21/22   Lewayne Bunting, MD  fluticasone (FLONASE) 50 MCG/ACT nasal spray Place 2 sprays into both nostrils daily as needed for allergies. 08/27/22   [provider]  nitroGLYCERIN (NITROSTAT) 0.4 MG SL tablet Place 1 tablet (0.4 mg total) under the tongue every 5 (five) minutes as needed for chest pain. 10/20/22 01/18/23   Lewayne Bunting, MD    Family History    Family History  Problem Relation Age of Onset   Coronary artery disease Brother    Heart attack Brother    He indicated that one of his two brothers is deceased.  Social History    Social History   Socioeconomic History   Marital status: Married    Spouse name: Not on file   Number of children: Not on file   Years of education: Not on file   Highest education level: Not on file  Occupational History   Not on file  Tobacco Use   Smoking status: Never   Smokeless tobacco: Never   Tobacco comments:    Never smoke 06/03/22  Substance and Sexual Activity   Alcohol use: Yes    Alcohol/week: 2.0 standard drinks of alcohol    Types: 2 Standard drinks or equivalent per week    Comment: 2  drink a week 06/03/22   Drug use: Not Currently   Sexual activity: Not on file  Other Topics Concern   Not on file  Social History Narrative   Not on file   Social Determinants of Health   Financial Resource Strain: Not on file  Food Insecurity: Not on file  Transportation Needs: Not on file  Physical Activity: Not on file  Stress: Not on file  Social Connections: Not on file  Intimate Partner Violence: Not on file     Review of Systems    General:  No chills, fever, night sweats or weight changes.  Cardiovascular:  No chest pain, dyspnea on exertion, edema, orthopnea, palpitations, paroxysmal nocturnal dyspnea. Dermatological: No rash, lesions/masses Respiratory: No cough, dyspnea Urologic: No hematuria, dysuria Abdominal:   No nausea, vomiting, diarrhea, bright red blood per rectum, melena, or hematemesis Neurologic:  No visual changes, wkns, changes in mental status. All other systems reviewed and are otherwise negative except as noted above.  Physical Exam    VS:  BP 112/78   Pulse 60   Ht 6\' 2"  (1.88 m)   Wt 206 lb 9.6 oz (93.7 kg)   SpO2 96%   BMI 26.53 kg/m  , BMI Body mass index is 26.53 kg/m. GEN: Well nourished, well  developed, in no acute distress. HEENT: normal. Neck: Supple, no JVD, carotid bruits, or masses. Cardiac: RRR, no murmurs, rubs, or gallops. No clubbing, cyanosis, edema.  Radials/DP/PT 2+ and equal bilaterally.  Respiratory:  Respirations regular and unlabored, clear to auscultation bilaterally. GI: Soft, nontender, nondistended, BS + x 4. MS: no deformity or atrophy. Skin: warm and dry, no rash. Neuro:  Strength and sensation are intact. Psych: Normal affect.  Accessory Clinical Findings    Recent Labs: 11/28/2021: ALT 27 10/22/2022: BUN 14; Creatinine, Ser 0.96; Hemoglobin 14.2; Platelets 212; Potassium 4.1; Sodium 139   Recent Lipid Panel    Component Value Date/Time   CHOL 116 11/28/2021 1125   TRIG 63 11/28/2021 1125   HDL 46 11/28/2021 1125   CHOLHDL 2.5 11/28/2021 1125   CHOLHDL 3 03/02/2013 0907   VLDL  10.6 03/02/2013 0907   LDLCALC 56 11/28/2021 1125         ECG personally reviewed by me today-none today.  Echocardiogram 12/23/2021 IMPRESSIONS     1. Left ventricular ejection fraction, by estimation, is 60 to 65%. The  left ventricle has normal function. The left ventricle has no regional  wall motion abnormalities. Left ventricular diastolic parameters were  normal. The average left ventricular  global longitudinal strain is -23.2 %. The global longitudinal strain is  normal.   2. Right ventricular systolic function is normal. The right ventricular  size is normal. There is normal pulmonary artery systolic pressure.   3. Left atrial size was mild to moderately dilated.   4. The mitral valve is normal in structure. Trivial mitral valve  regurgitation. No evidence of mitral stenosis.   5. The aortic valve is tricuspid. Aortic valve regurgitation is trivial.  No aortic stenosis is present.   6. Aortic dilatation noted. There is mild dilatation of the ascending  aorta, measuring 43 mm.   7. The inferior vena cava is normal in size with greater than 50%   respiratory variability, suggesting right atrial pressure of 3 mmHg.   Comparison(s): Prior images reviewed side by side.   Conclusion(s)/Recommendation(s): Otherwise normal echocardiogram, with  minor abnormalities described in the report. Mild dilation of ascending  aorta (43 mm), prior 40 mm by echo in 2021.   FINDINGS   Left Ventricle: Left ventricular ejection fraction, by estimation, is 60  to 65%. The left ventricle has normal function. The left ventricle has no  regional wall motion abnormalities. The average left ventricular global  longitudinal strain is -23.2 %.  The global longitudinal strain is normal. The left ventricular internal  cavity size was normal in size. There is no left ventricular hypertrophy.  Left ventricular diastolic parameters were normal.   Right Ventricle: The right ventricular size is normal. No increase in  right ventricular wall thickness. Right ventricular systolic function is  normal. There is normal pulmonary artery systolic pressure. The tricuspid  regurgitant velocity is 1.51 m/s, and   with an assumed right atrial pressure of 3 mmHg, the estimated right  ventricular systolic pressure is 12.1 mmHg.   Left Atrium: Left atrial size was mild to moderately dilated.   Right Atrium: Right atrial size was normal in size.   Pericardium: There is no evidence of pericardial effusion.   Mitral Valve: The mitral valve is normal in structure. Trivial mitral  valve regurgitation. No evidence of mitral valve stenosis.   Tricuspid Valve: The tricuspid valve is normal in structure. Tricuspid  valve regurgitation is trivial. No evidence of tricuspid stenosis.   Aortic Valve: The aortic valve is tricuspid. Aortic valve regurgitation is  trivial. No aortic stenosis is present.   Pulmonic Valve: The pulmonic valve was grossly normal. Pulmonic valve  regurgitation is trivial. No evidence of pulmonic stenosis.   Aorta: Aortic dilatation noted. There is  mild dilatation of the ascending  aorta, measuring 43 mm.   Venous: The inferior vena cava is normal in size with greater than 50%  respiratory variability, suggesting right atrial pressure of 3 mmHg.   IAS/Shunts: The atrial septum is grossly normal.    Cardiac catheterization 10/29/2022    Mid RCA lesion is 30% stenosed.   Dist RCA lesion is 50% stenosed.   RPDA lesion is 30% stenosed.   RPAV lesion is 20% stenosed.   Prox Cx lesion is 20% stenosed.   Prox LAD to Mid  LAD lesion is 20% stenosed.   The left ventricular systolic function is normal.   LV end diastolic pressure is normal.   The left ventricular ejection fraction is 55-65% by visual estimate.   The LAD is a large caliber vessel that does not reach the apex. There is mild calcified disease in the proximal vessel (20%) The Circumflex is a large caliber vessel with mild calcified disease in the proximal vessel (20%) The RCA is a large dominant vessel with mild mid vessel stenosis and mild to moderate distal stenosis (50%), unchanged from last cath.  Normal LV filling pressures.    Recommendations: Continue medical management of CAD. Resume Eliquis tomorrow. His recent symptoms are not cardiac related.  Diagnostic Dominance: Right  Intervention  Assessment & Plan   1.  Coronary artery disease-no chest pain today.  Underwent cardiac catheterization on 10/29/2022.  He was noted to have nonobstructive CAD.  Details above.  Medical management was recommended.  His symptoms are not associated with cardiac issues. Continue current medical therapy Heart healthy low-sodium diet Increase physical activity as tolerated  Paroxysmal atrial fibrillation-heart rate today 60 bpm.  Reports compliance with apixaban.  Denies bleeding issues. Avoid triggers caffeine, chocolate, EtOH, dehydration etc. Continue apixaban  Hyperlipidemia-11/28/2021: Cholesterol, Total 116; HDL 46; LDL Chol Calc (NIH) 56; Triglycerides 63 Continue  atorvastatin High-fiber diet Increase physical activity as tolerated Repeat fasting lipids and LFTs  Disposition: Follow-up with Dr. Jens Som in 4-6 months.     Thomasene Ripple. Yvette Loveless NP-C     11/19/2022, 10:18 AM [facility] [address] Office [phone] Fax [phone]    I spent 14 minutes examining this patient, reviewing medications, and using patient centered shared decision making involving her cardiac care.  Prior to her visit I spent greater than 20 minutes reviewing her past medical history,  medications, and prior cardiac tests.

## 2022-11-18 IMAGING — US US AORTA
1 series · 14 of 16 positions shown · non-contrast
Comparison: None.

CLINICAL DATA: History of aneurysmal disease of the ascending
thoracic aorta.

EXAM:
ULTRASOUND OF ABDOMINAL AORTA
TECHNIQUE: Ultrasound examination of the abdominal aorta and proximal common
iliac arteries was performed to evaluate for aneurysm. Additional
color and Doppler images of the distal aorta were obtained to
document patency.

[Series 1: us aorta · 0.34mm/px · 14 of 16 slices shown]
[im 1/16]
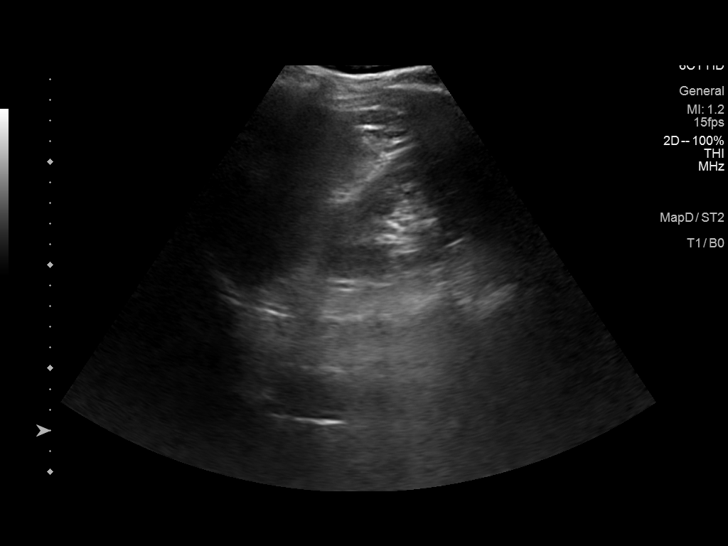
[im 2/16]
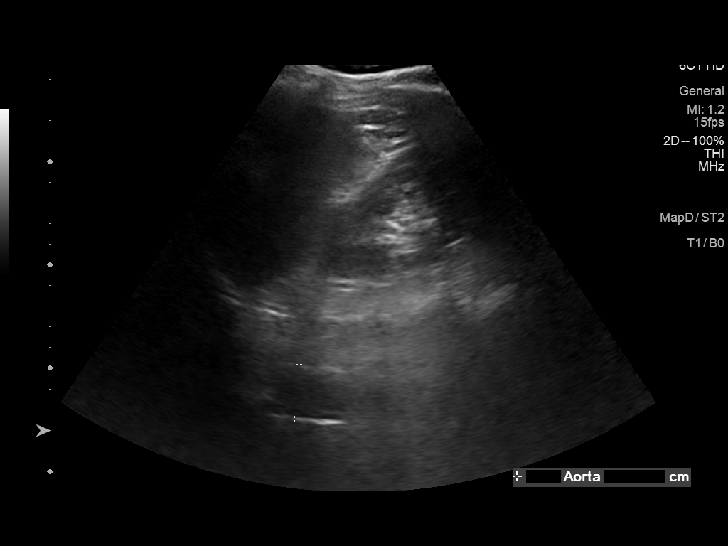
[im 3/16]
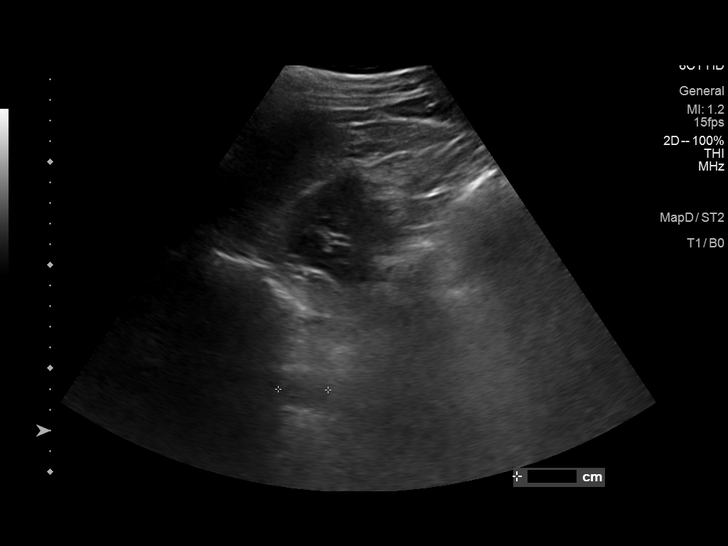
[im 5/16]
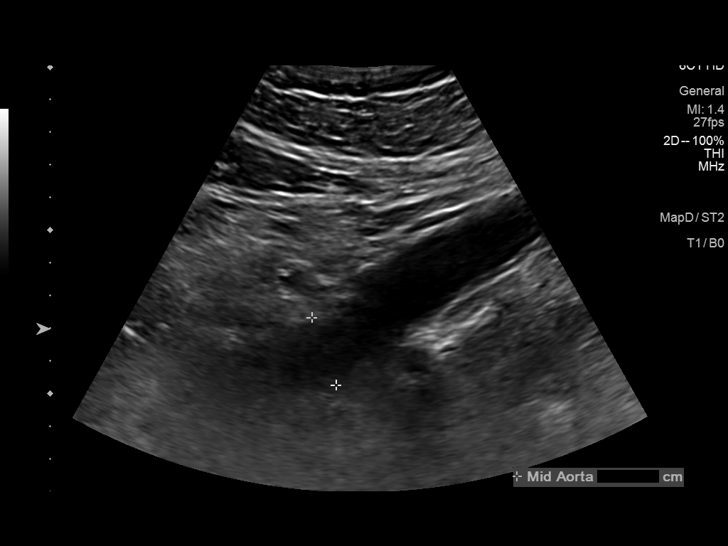
[im 6/16]
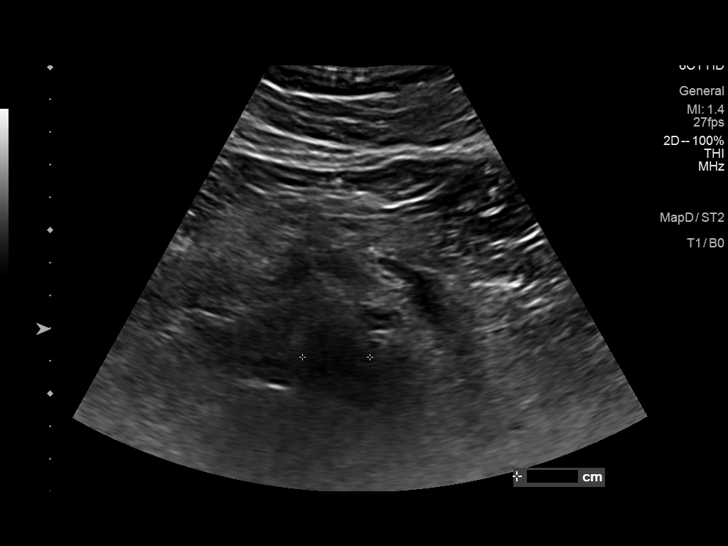
[im 7/16]
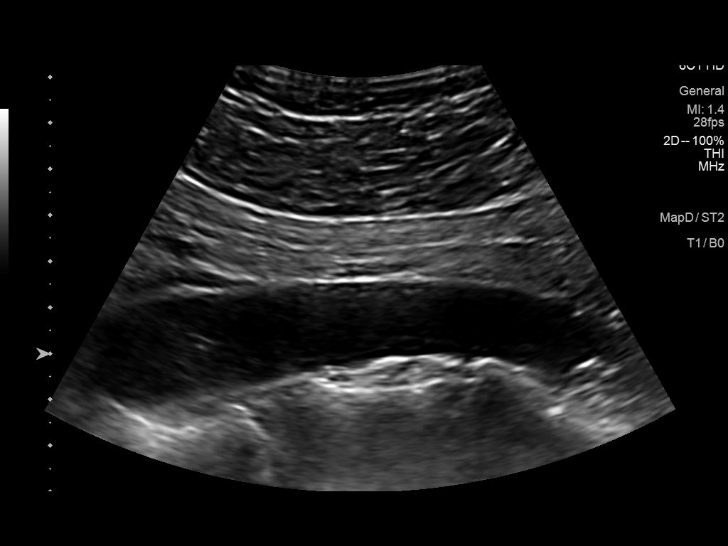
[im 8/16]
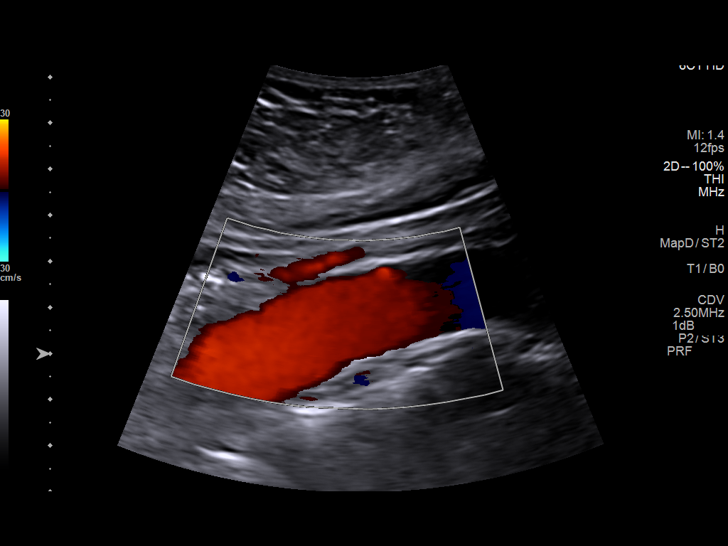
[im 9/16]
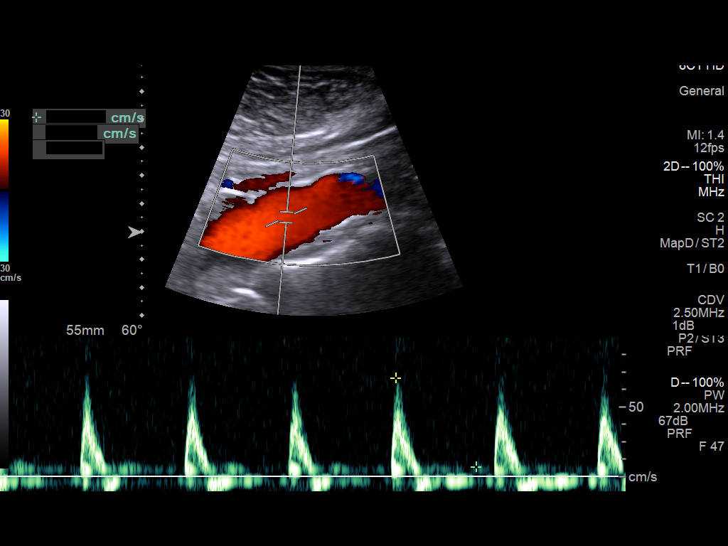
[im 10/16]
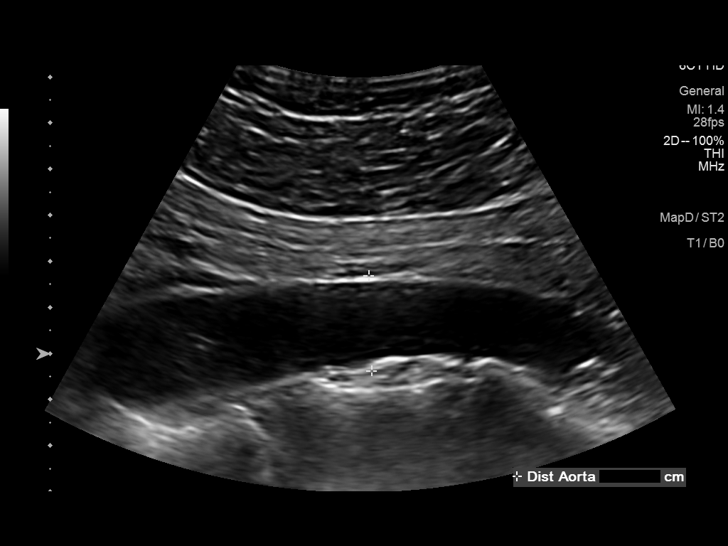
[im 11/16]
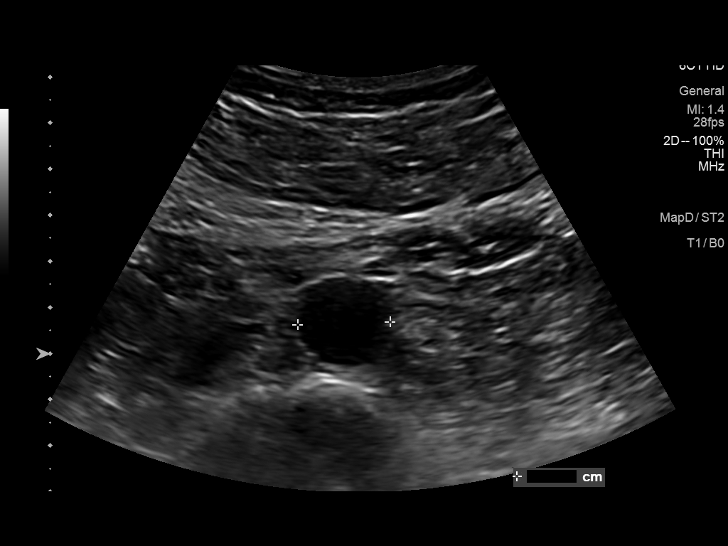
[im 13/16]
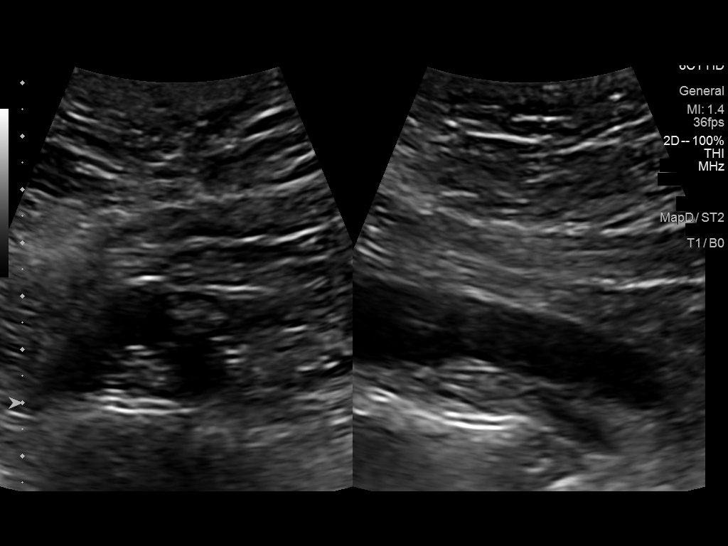
[im 14/16]
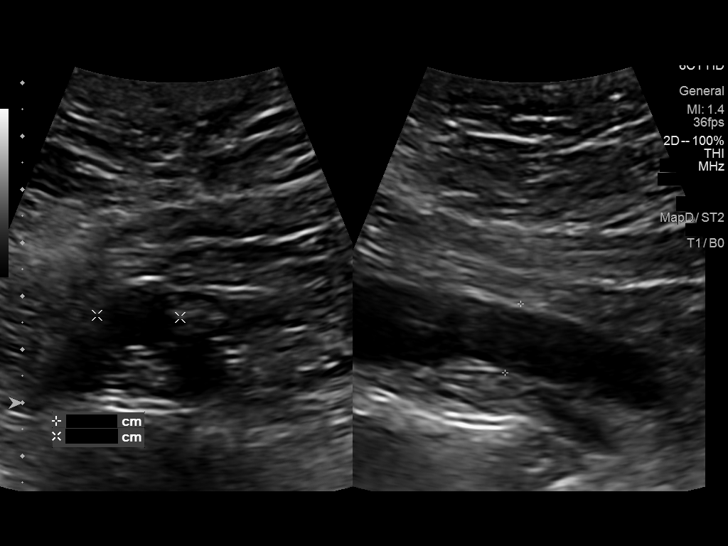
[im 15/16]
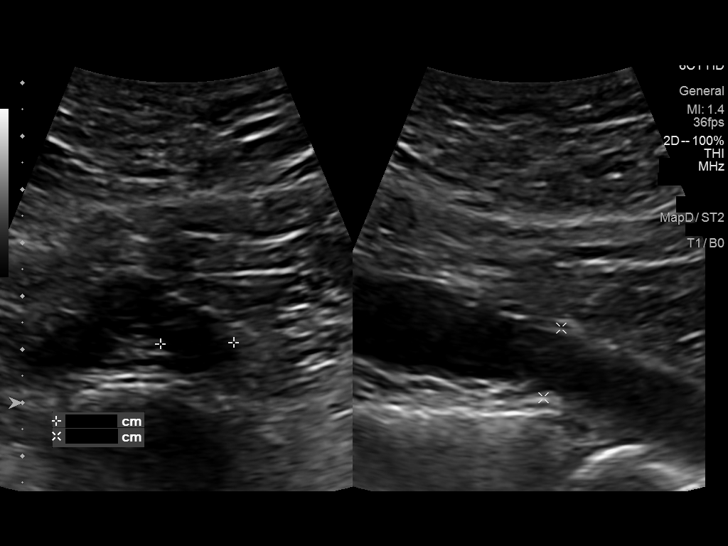
[im 16/16]
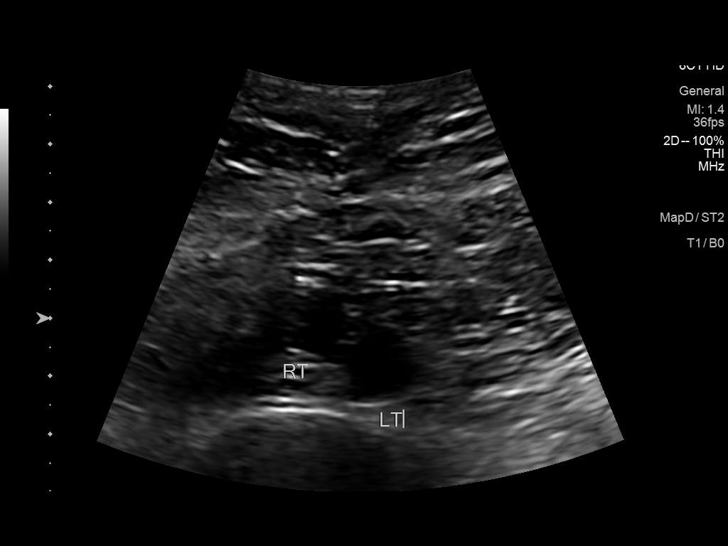

[14 of 16 positions shown; findings below may reference images not displayed]

FINDINGS: Abdominal aortic measurements as follows:

Proximal:  2.4 x 2.7 cm

Mid:  2.1 x 2.2 cm

Distal:  2.0 x 2.1 cm

Atherosclerosis present.
Patent: Yes, peak systolic velocity is 71 cm/s

Right common iliac artery: 1.6 cm

Left common iliac artery: 1.4 cm
IMPRESSION: Atherosclerosis of the abdominal aorta without evidence of aneurysm.

## 2022-11-19 ENCOUNTER — Ambulatory Visit: Payer: Medicare HMO | Attending: General Practice | Admitting: General Practice

## 2022-11-19 ENCOUNTER — Encounter: Payer: Self-pay | Admitting: General Practice

## 2022-11-19 VITALS — BP 112/78 | HR 60 | Ht 74.0 in | Wt 206.6 lb

## 2022-11-19 DIAGNOSIS — I25110 Atherosclerotic heart disease of native coronary artery with unstable angina pectoris: Secondary | ICD-10-CM

## 2022-11-19 DIAGNOSIS — E785 Hyperlipidemia, unspecified: Secondary | ICD-10-CM

## 2022-11-19 DIAGNOSIS — I480 Paroxysmal atrial fibrillation: Secondary | ICD-10-CM

## 2022-11-19 NOTE — Patient Instructions (Signed)
Medication Instructions:  NONE *If you need a refill on your cardiac medications before your next appointment, please call your pharmacy*  Lab Work: FASTING LIPID AND LFT IN 2 WEEKS If you have labs (blood work) drawn today and your tests are completely normal, you will receive your results only by:  MyChart Message (if you have MyChart) OR  A paper copy in the mail If you have any lab test that is abnormal or we need to change your treatment, we will call you to review the results.  Testing/Procedures: NONE  Follow-Up: At [facility], you and your health needs are our priority.  As part of our continuing mission to provide you with exceptional heart care, we have created designated Provider Care Teams.  These Care Teams include your primary Cardiologist (physician) and Advanced Practice Providers (APPs -  Physician Assistants and Nurse Practitioners) who all work together to provide you with the care you need, when you need it.  Your next appointment:   4-6 month(s)  Provider:   Olga Millers, MD     Other Instructions

## 2023-02-09 ENCOUNTER — Ambulatory Visit: Payer: Medicare HMO | Admitting: Cardiology

## 2023-02-09 ENCOUNTER — Encounter: Payer: Self-pay | Admitting: Cardiology

## 2023-02-09 VITALS — BP 130/86 | HR 54 | Ht 74.0 in | Wt 205.0 lb

## 2023-02-09 DIAGNOSIS — D6869 Other thrombophilia: Secondary | ICD-10-CM

## 2023-02-09 DIAGNOSIS — I480 Paroxysmal atrial fibrillation: Secondary | ICD-10-CM

## 2023-02-09 NOTE — Progress Notes (Signed)
  Electrophysiology Office Note:   Date:  02/09/2023  ID:  [name] [name], DOB Aug 18, 1955, MRN [id number]  Primary Cardiologist: Olga Millers, MD Electrophysiologist: Lanetta Figuero Jorja Loa, MD      History of Present Illness:   [name] [name] is a 67 y.o. male with h/o atrial fibrillation seen today for routine electrophysiology followup.  Since last being seen in our clinic the patient reports doing well.  He has noted no further episodes of atrial fibrillation.  He is gone back to playing basketball and is able to exert himself without issue.  he denies chest pain, palpitations, dyspnea, PND, orthopnea, nausea, vomiting, dizziness, syncope, edema, weight gain, or early satiety.     He has a history significant for coronary artery disease and atrial fibrillation. He had a catheterization 2009 that showed mild to moderate nonobstructive disease. He has been treated medically. He wore a Zio patch that showed due to an episode of presyncope and was found to have atrial fibrillation and flutter with bradycardia. He is now status post ablation 05/06/2022.        Review of systems complete and found to be negative unless listed in HPI.   EP Information / Studies Reviewed:    EKG is ordered today. Personal review as below.  EKG Interpretation Date/Time:  Tuesday February 09 2023 16:08:01 EDT Ventricular Rate:  54 PR Interval:  186 QRS Duration:  104 QT Interval:  464 QTC Calculation: 440 R Axis:   63  Text Interpretation: Sinus bradycardia No significant change since last tracing Confirmed by Nalani Andreen ([id number]) on 02/09/2023 4:15:04 PM     Risk Assessment/Calculations:    CHA2DS2-VASc Score = 2   This indicates a 2.2% annual risk of stroke. The patient's score is based upon: CHF History: 0 HTN History: 0 Diabetes History: 0 Stroke History: 0 Vascular Disease History: 1 Age Score: 1 Gender Score: 0             Physical Exam:   VS:  BP 130/86 (BP Location: Left Arm,  Patient Position: Sitting, Cuff Size: Large)   Pulse (!) 54   Ht 6\' 2"  (1.88 m)   Wt 205 lb (93 kg)   SpO2 94%   BMI 26.32 kg/m    Wt Readings from Last 3 Encounters:  02/09/23 205 lb (93 kg)  11/19/22 206 lb 9.6 oz (93.7 kg)  10/29/22 204 lb (92.5 kg)     GEN: Well nourished, well developed in no acute distress NECK: No JVD; No carotid bruits CARDIAC: Irregularly irregular rate and rhythm, no murmurs, rubs, gallops RESPIRATORY:  Clear to auscultation without rales, wheezing or rhonchi  ABDOMEN: Soft, non-tender, non-distended EXTREMITIES:  No edema; No deformity   ASSESSMENT AND PLAN:    1.  Paroxysmal atrial fibrillation/flutter: Currently on Eliquis.  Status post ablation 05/06/2022.  He has remained in sinus rhythm.  He has had no further episodes of atrial fibrillation.  [name] continue with current management.  2.  Coronary artery disease: Moderate disease in 2009.  Management per primary cardiology.  3.  Secondary hypercoagulable state: Currently on Eliquis for atrial fibrillation  Follow up with Afib Clinic in 6 months  Signed, Kalliopi Coupland Jorja Loa, MD

## 2023-02-09 NOTE — Patient Instructions (Signed)
Medication Instructions:  Your physician recommends that you continue on your current medications as directed. Please refer to the Current Medication list given to you today.  *If you need a refill on your cardiac medications before your next appointment, please call your pharmacy*   Lab Work: None ordered   Testing/Procedures: None ordered   Follow-Up: At [facility], you and your health needs are our priority.  As part of our continuing mission to provide you with exceptional heart care, we have created designated Provider Care Teams.  These Care Teams include your primary Cardiologist (physician) and Advanced Practice Providers (APPs -  Physician Assistants and Nurse Practitioners) who all work together to provide you with the care you need, when you need it.  Your next appointment:   6 month(s)  The format for your next appointment:   In Person  Provider:   You will follow up in the Atrial Fibrillation Clinic located at [facility]. Your provider will be: Clint R. Fenton, PA-C  Or  Jon Suarez, PA  Thank you for choosing [facility]!!    Sherri Price, RN [phone]          

## 2023-04-15 NOTE — Progress Notes (Signed)
HPI: FU coronary artery disease and atrial fibrillation. Calcium score February 2021 2793. Ascending thoracic aorta measured 42 mm. CTA July 2022 showed stable 4.2 cm ascending thoracic aortic aneurysm. Abdominal ultrasound March 2023 showed no aneurysm.  Previous monitor showed paroxysmal atrial fibrillation/flutter and also occasional bradycardia. Patient placed on apixaban.  Beta-blocker and Cardizem avoided in the setting of occasional bradycardia with posttermination pauses. Echocardiogram June 2023 showed normal LV function, mild to moderate left atrial enlargement, trace aortic insufficiency and mildly dilated ascending aorta at 43 mm.  Patient seen by Dr. Elberta Fortis and atrial fibrillation ablation has been scheduled.  Carotid Dopplers August 2023 near normal bilaterally.  Had atrial fibrillation ablation October 2023.  Cardiac catheterization April 2024 showed 50% right coronary artery, 20% LAD and normal LV function.  Since I last saw him, the patient has dyspnea with more extreme activities but not with routine activities. It is relieved with rest. It is not associated with chest pain. There is no orthopnea, PND or pedal edema. There is no syncope or palpitations. There is no exertional chest pain.   Current Outpatient Medications  Medication Sig Dispense Refill   apixaban (ELIQUIS) 5 MG TABS tablet Take 1 tablet (5 mg total) by mouth 2 (two) times daily. 60 tablet 5   atorvastatin (LIPITOR) 80 MG tablet Take 1 tablet by mouth once daily (Patient taking differently: Take 80 mg by mouth every evening.) 90 tablet 0   fluticasone (FLONASE) 50 MCG/ACT nasal spray Place 2 sprays into both nostrils daily as needed for allergies.     nitroGLYCERIN (NITROSTAT) 0.4 MG SL tablet Place 1 tablet (0.4 mg total) under the tongue every 5 (five) minutes as needed for chest pain. 25 tablet 3   No current facility-administered medications for this visit.     Past Medical History:  Diagnosis Date    BRADYCARDIA    Coronary artery disease    Hyperlipidemia    PAF (paroxysmal atrial fibrillation) (HCC)     Past Surgical History:  Procedure Laterality Date   ATRIAL FIBRILLATION ABLATION N/A 05/06/2022   Procedure: ATRIAL FIBRILLATION ABLATION;  Surgeon: Regan Lemming, MD;  Location: [facility];  Service: Cardiovascular;  Laterality: N/A;   HERNIA REPAIR     LEFT HEART CATH AND CORONARY ANGIOGRAPHY N/A 10/29/2022   Procedure: LEFT HEART CATH AND CORONARY ANGIOGRAPHY;  Surgeon: Kathleene Hazel, MD;  Location: [facility];  Service: Cardiovascular;  Laterality: N/A;   TONSILLECTOMY      Social History   Socioeconomic History   Marital status: Married    Spouse name: Not on file   Number of children: Not on file   Years of education: Not on file   Highest education level: Not on file  Occupational History   Not on file  Tobacco Use   Smoking status: Never   Smokeless tobacco: Never   Tobacco comments:    Never smoke 06/03/22  Substance and Sexual Activity   Alcohol use: Yes    Alcohol/week: 2.0 standard drinks of alcohol    Types: 2 Standard drinks or equivalent per week    Comment: 2  drink a week 06/03/22   Drug use: Not Currently   Sexual activity: Not on file  Other Topics Concern   Not on file  Social History Narrative   Not on file   Social Determinants of Health   Financial Resource Strain: Not on file  Food Insecurity: No Food Insecurity (02/14/2021)  Received from [facility], [facility]   Hunger Vital Sign    Worried About Running Out of Food in the Last Year: Never true    Ran Out of Food in the Last Year: Never true  Transportation Needs: Not on file  Physical Activity: Not on file  Stress: Not on file  Social Connections: Unknown (11/18/2021)   Received from [facility], [facility]   Social Network    Social Network: Not on file  Intimate Partner Violence: Unknown (10/14/2021)   Received from [facility], [facility]   HITS    Physically Hurt: Not on file    Insult or Talk Down To: Not on file    Threaten Physical Harm: Not on file    Scream or Curse: Not on file    Family History  Problem Relation Age of Onset   Coronary artery disease Brother    Heart attack Brother     ROS: no fevers or chills, productive cough, hemoptysis, dysphasia, odynophagia, melena, hematochezia, dysuria, hematuria, rash, seizure activity, orthopnea, PND, pedal edema, claudication. Remaining systems are negative.  Physical Exam: Well-developed well-nourished in no acute distress.  Skin is warm and dry.  HEENT is normal.  Neck is supple.  Chest is clear to auscultation with normal expansion.  Cardiovascular exam is regular rate and rhythm.  Abdominal exam nontender or distended. No masses palpated. Extremities show no edema. neuro grossly intact  A/P  1 paroxysmal atrial fibrillation-patient is now status post ablation and remains in sinus rhythm.  Continue apixaban.  He is not on an AV nodal blocking agent given history of bradycardia.  2 coronary artery disease-patient denies chest pain.  Continue statin.  No aspirin given need for apixaban.  3 dilated thoracic aorta-we will arrange follow-up CTA to further assess.  4 hyperlipidemia-continue statin.  Olga Millers, MD

## 2023-04-28 LAB — HEPATIC FUNCTION PANEL
ALT: 31 [IU]/L (ref 0–44)
AST: 24 [IU]/L (ref 0–40)
Albumin: 4.2 g/dL (ref 3.9–4.9)
Alkaline Phosphatase: 58 [IU]/L (ref 44–121)
Bilirubin Total: 0.9 mg/dL (ref 0.0–1.2)
Bilirubin, Direct: 0.26 mg/dL (ref 0.00–0.40)
Total Protein: 6.1 g/dL (ref 6.0–8.5)

## 2023-04-28 LAB — LIPID PANEL
Chol/HDL Ratio: 2.5 {ratio} (ref 0.0–5.0)
Cholesterol, Total: 112 mg/dL (ref 100–199)
HDL: 45 mg/dL (ref >39)
LDL Chol Calc (NIH): 55 mg/dL (ref 0–99)
Triglycerides: 52 mg/dL (ref 0–149)
VLDL Cholesterol Cal: 12 mg/dL (ref 5–40)

## 2023-04-29 ENCOUNTER — Ambulatory Visit: Payer: Medicare HMO | Attending: Cardiology | Admitting: Cardiology

## 2023-04-29 ENCOUNTER — Encounter: Payer: Self-pay | Admitting: Cardiology

## 2023-04-29 VITALS — BP 114/70 | HR 55 | Ht 74.0 in | Wt 206.0 lb

## 2023-04-29 DIAGNOSIS — I25110 Atherosclerotic heart disease of native coronary artery with unstable angina pectoris: Secondary | ICD-10-CM

## 2023-04-29 DIAGNOSIS — I480 Paroxysmal atrial fibrillation: Secondary | ICD-10-CM | POA: Diagnosis not present

## 2023-04-29 DIAGNOSIS — E785 Hyperlipidemia, unspecified: Secondary | ICD-10-CM

## 2023-04-29 NOTE — Patient Instructions (Signed)
Medication Instructions:  None *If you need a refill on your cardiac medications before your next appointment, please call your pharmacy*   Lab Work: None If you have labs (blood work) drawn today and your tests are completely normal, you will receive your results only by: MyChart Message (if you have MyChart) OR A paper copy in the mail If you have any lab test that is abnormal or we need to change your treatment, we will call you to review the results.   Testing/Procedures: CTA of the chest at [facility] [address]    Follow-Up: At [facility], you and your health needs are our priority.  As part of our continuing mission to provide you with exceptional heart care, we have created designated Provider Care Teams.  These Care Teams include your primary Cardiologist (physician) and Advanced Practice Providers (APPs -  Physician Assistants and Nurse Practitioners) who all work together to provide you with the care you need, when you need it.    Your next appointment:   6 month(s)  Provider:   Olga Millers, MD     Other Instructions

## 2023-05-05 NOTE — Progress Notes (Signed)
Letter mailed

## 2023-05-12 ENCOUNTER — Other Ambulatory Visit: Payer: Self-pay

## 2023-05-12 ENCOUNTER — Other Ambulatory Visit: Payer: Self-pay | Admitting: Cardiology

## 2023-05-12 DIAGNOSIS — I480 Paroxysmal atrial fibrillation: Secondary | ICD-10-CM

## 2023-05-12 NOTE — Telephone Encounter (Signed)
OPEN IN ERROR 

## 2023-05-12 NOTE — Telephone Encounter (Signed)
Prescription refill request for Eliquis received. Indication: PAF Last office visit: 04/29/23  Velta Addison MD Scr: 0.96 on 10/22/22  Epic Age: 67 Weight: 93.4kg  Based on above findings Eliquis 5mg  twice daily is the appropriate dose.  Refill approved.

## 2023-05-13 ENCOUNTER — Ambulatory Visit
Admission: RE | Admit: 2023-05-13 | Discharge: 2023-05-13 | Disposition: A | Discharge status: Home / Self Care | Payer: Medicare HMO | Source: Ambulatory Visit | Attending: Cardiology | Admitting: Cardiology

## 2023-05-13 DIAGNOSIS — I480 Paroxysmal atrial fibrillation: Secondary | ICD-10-CM

## 2023-05-13 MED ORDER — IOPAMIDOL (ISOVUE-370) INJECTION 76%
200.0000 mL | Freq: Once | INTRAVENOUS | Status: AC | PRN
  Administered 2023-05-13: 75 mL via INTRAVENOUS

## 2023-05-20 ENCOUNTER — Other Ambulatory Visit: Payer: Self-pay | Admitting: *Deleted

## 2023-08-12 ENCOUNTER — Encounter (HOSPITAL_COMMUNITY): Payer: Self-pay | Admitting: Internal Medicine

## 2023-08-12 ENCOUNTER — Ambulatory Visit (HOSPITAL_COMMUNITY)
Admission: RE | Admit: 2023-08-12 | Discharge: 2023-08-12 | Disposition: A | Discharge status: Home / Self Care | Payer: Self-pay | Source: Ambulatory Visit | Attending: Internal Medicine | Admitting: Internal Medicine

## 2023-08-12 VITALS — BP 116/84 | HR 63 | Ht 74.0 in | Wt 208.6 lb

## 2023-08-12 DIAGNOSIS — D6869 Other thrombophilia: Secondary | ICD-10-CM | POA: Insufficient documentation

## 2023-08-12 DIAGNOSIS — I480 Paroxysmal atrial fibrillation: Secondary | ICD-10-CM | POA: Insufficient documentation

## 2023-08-12 DIAGNOSIS — I4892 Unspecified atrial flutter: Secondary | ICD-10-CM | POA: Insufficient documentation

## 2023-08-12 DIAGNOSIS — Z7901 Long term (current) use of anticoagulants: Secondary | ICD-10-CM | POA: Insufficient documentation

## 2023-08-12 DIAGNOSIS — I2510 Atherosclerotic heart disease of native coronary artery without angina pectoris: Secondary | ICD-10-CM | POA: Insufficient documentation

## 2023-08-12 DIAGNOSIS — E785 Hyperlipidemia, unspecified: Secondary | ICD-10-CM | POA: Insufficient documentation

## 2023-08-12 NOTE — Progress Notes (Signed)
Primary Care Physician: Mercy Moore, MD Primary Cardiologist: Dr Jens Som  Primary Electrophysiologist: Dr Elberta Fortis Referring Physician: Dr Elberta Fortis   [name] [name] is a 68 y.o. male with a history of CAD, HLD, atrial fibrillation who presents for consultation in the [facility]. Patient was diagnosed with afib 11/2021 on a cardiac monitor placed by his PCP. Patient is on Eliquis for a CHADS2VASC score of 2. He was seen by Dr Elberta Fortis and started on amiodarone as a bridge to ablation. He underwent afib and flutter ablation with Dr Elberta Fortis on 05/06/22.   On follow up today, patient reports that he has done well since the procedure with no interim afib episodes. He denies chest pain, swallowing pain, or groin issues. No bleeding issues on anticoagulation.   On follow up 08/12/23, he is currently in NSR. He has had no episodes of Afib since last office visit with Dr. Jens Som. He wears an Apple watch for monitoring. He is active and plays basketball once a week. No missed doses of Eliquis.   Today, he denies symptoms of palpitations, chest pain, shortness of breath, orthopnea, PND, lower extremity edema, dizziness, presyncope, syncope, snoring, daytime somnolence, bleeding, or neurologic sequela. The patient is tolerating medications without difficulties and is otherwise without complaint today.    Atrial Fibrillation Risk Factors:  he does not have symptoms or diagnosis of sleep apnea. he does not have a history of rheumatic fever.   he has a BMI of Body mass index is 26.78 kg/m.Marland Kitchen Filed Weights   08/12/23 1323  Weight: 94.6 kg     Family History  Problem Relation Age of Onset   Coronary artery disease Brother    Heart attack Brother      Atrial Fibrillation Management history:  Previous antiarrhythmic drugs: amiodarone  Previous cardioversions: none Previous ablations: 05/06/22 CHADS2VASC score: 2 Anticoagulation history: Eliquis   Past  Medical History:  Diagnosis Date   BRADYCARDIA    Coronary artery disease    Hyperlipidemia    PAF (paroxysmal atrial fibrillation) (HCC)    Past Surgical History:  Procedure Laterality Date   ATRIAL FIBRILLATION ABLATION N/A 05/06/2022   Procedure: ATRIAL FIBRILLATION ABLATION;  Surgeon: Regan Lemming, MD;  Location: [facility];  Service: Cardiovascular;  Laterality: N/A;   HERNIA REPAIR     LEFT HEART CATH AND CORONARY ANGIOGRAPHY N/A 10/29/2022   Procedure: LEFT HEART CATH AND CORONARY ANGIOGRAPHY;  Surgeon: Kathleene Hazel, MD;  Location: [facility];  Service: Cardiovascular;  Laterality: N/A;   TONSILLECTOMY      Current Outpatient Medications  Medication Sig Dispense Refill   apixaban (ELIQUIS) 5 MG TABS tablet Take 1 tablet by mouth twice daily 60 tablet 5   atorvastatin (LIPITOR) 80 MG tablet Take 1 tablet by mouth once daily 90 tablet 0   fluticasone (FLONASE) 50 MCG/ACT nasal spray Place 2 sprays into both nostrils daily as needed for allergies.     nitroGLYCERIN (NITROSTAT) 0.4 MG SL tablet Place 1 tablet (0.4 mg total) under the tongue every 5 (five) minutes as needed for chest pain. 25 tablet 3   No current facility-administered medications for this encounter.    No Known Allergies  ROS- All systems are reviewed and negative except as per the HPI above.  Physical Exam: Vitals:   08/12/23 1323  BP: 116/84  Pulse: 63  Weight: 94.6 kg  Height: 6\' 2"  (1.88 m)    GEN- The patient is well appearing, alert  and oriented x 3 today.   Neck - no JVD or carotid bruit noted Lungs- Clear to ausculation bilaterally, normal work of breathing Heart- Regular rate and rhythm, no murmurs, rubs or gallops, PMI not laterally displaced Extremities- no clubbing, cyanosis, or edema Skin - no rash or ecchymosis noted   Wt Readings from Last 3 Encounters:  08/12/23 94.6 kg  04/29/23 93.4 kg  02/09/23 93 kg    EKG today demonstrates  Vent. rate 63  BPM PR interval 182 ms QRS duration 100 ms QT/QTcB 412/421 ms P-R-T axes 45 20 20 Normal sinus rhythm Normal ECG When compared with ECG of 09-Feb-2023 16:08, PREVIOUS ECG IS PRESENT  Echo 12/23/21 demonstrated   1. Left ventricular ejection fraction, by estimation, is 60 to 65%. The  left ventricle has normal function. The left ventricle has no regional  wall motion abnormalities. Left ventricular diastolic parameters were  normal. The average left ventricular global longitudinal strain is -23.2 %. The global longitudinal strain is normal.   2. Right ventricular systolic function is normal. The right ventricular  size is normal. There is normal pulmonary artery systolic pressure.   3. Left atrial size was mild to moderately dilated.   4. The mitral valve is normal in structure. Trivial mitral valve  regurgitation. No evidence of mitral stenosis.   5. The aortic valve is tricuspid. Aortic valve regurgitation is trivial.  No aortic stenosis is present.   6. Aortic dilatation noted. There is mild dilatation of the ascending  aorta, measuring 43 mm.   7. The inferior vena cava is normal in size with greater than 50%  respiratory variability, suggesting right atrial pressure of 3 mmHg.   Comparison(s): Prior images reviewed side by side.   Conclusion(s)/Recommendation(s): Otherwise normal echocardiogram, with  minor abnormalities described in the report. Mild dilation of ascending  aorta (43 mm), prior 40 mm by echo in 2021.   Epic records are reviewed at length today  CHA2DS2-VASc Score = 2  The patient's score is based upon: CHF History: 0 HTN History: 0 Diabetes History: 0 Stroke History: 0 Vascular Disease History: 1 Age Score: 1 Gender Score: 0       ASSESSMENT AND PLAN: 1. Paroxysmal Atrial Fibrillation/atrial flutter The patient's CHA2DS2-VASc score is 2, indicating a 2.2% annual risk of stroke.   S/p afib and flutter ablation 05/06/22 by Dr. Elberta Fortis.  He is  currently in NSR. Overall very low burden of Afib since ablation.    2. Secondary Hypercoagulable State (ICD10:  D68.69) The patient is at significant risk for stroke/thromboembolism based upon his CHA2DS2-VASc Score of 2.  Continue Apixaban (Eliquis).  No missed doses of anticoagulant.   3. CAD He reports no anginal symptoms.    Follow up 6 months with Dr. Elberta Fortis.   Justin Mend, PA-C [facility] [address] [place], [place] [address] [phone] 08/12/2023 1:45 PM

## 2023-11-02 ENCOUNTER — Other Ambulatory Visit: Payer: Self-pay | Admitting: Cardiology

## 2023-11-02 DIAGNOSIS — I480 Paroxysmal atrial fibrillation: Secondary | ICD-10-CM

## 2023-11-02 NOTE — Telephone Encounter (Addendum)
 Eliquis  5mg  refill request received. Patient is 68 years old, weight-94.6kg, Crea-0.96 on 10/22/22-needs updated labs, Diagnosis-Afib, and last seen by Minnie Amber on 08/12/23. Dose is appropriate based on dosing criteria.    Pt needs updated labs.   Spoke with pt and he states he is looking for a PCP since Dr. Yaakov Heir closed his local practice. He states he will come to [facility] office to get labs on Thursday around 8am and head to another appt that he has a 9am on [employer]. Advised will send in a 30 day supply and await labs and he verbalized understanding.   Labs ordered.

## 2023-11-04 LAB — CBC

## 2023-11-05 LAB — BASIC METABOLIC PANEL WITH GFR
BUN/Creatinine Ratio: 19 (ref 10–24)
BUN: 16 mg/dL (ref 8–27)
CO2: 24 mmol/L (ref 20–29)
Calcium: 8.9 mg/dL (ref 8.6–10.2)
Chloride: 103 mmol/L (ref 96–106)
Creatinine, Ser: 0.84 mg/dL (ref 0.76–1.27)
Glucose: 93 mg/dL (ref 70–99)
Potassium: 5.1 mmol/L (ref 3.5–5.2)
Sodium: 141 mmol/L (ref 134–144)
eGFR: 96 mL/min/{1.73_m2} (ref >59)

## 2023-11-05 LAB — CBC
Hematocrit: 42.3 % (ref 37.5–51.0)
Hemoglobin: 14.2 g/dL (ref 13.0–17.7)
MCH: 29.3 pg (ref 26.6–33.0)
MCHC: 33.6 g/dL (ref 31.5–35.7)
MCV: 87 fL (ref 79–97)
Platelets: 202 10*3/uL (ref 150–450)
RBC: 4.84 x10E6/uL (ref 4.14–5.80)
RDW: 12.3 % (ref 11.6–15.4)
WBC: 6.7 10*3/uL (ref 3.4–10.8)

## 2023-11-08 ENCOUNTER — Encounter: Payer: Self-pay | Admitting: *Deleted

## 2023-11-28 ENCOUNTER — Other Ambulatory Visit: Payer: Self-pay | Admitting: Cardiology

## 2023-11-28 DIAGNOSIS — I480 Paroxysmal atrial fibrillation: Secondary | ICD-10-CM

## 2024-02-10 ENCOUNTER — Other Ambulatory Visit (HOSPITAL_COMMUNITY): Payer: Self-pay

## 2024-02-10 ENCOUNTER — Telehealth: Payer: Self-pay | Admitting: Cardiology

## 2024-02-10 DIAGNOSIS — E785 Hyperlipidemia, unspecified: Secondary | ICD-10-CM

## 2024-02-10 MED ORDER — ATORVASTATIN CALCIUM 80 MG PO TABS
80.0000 mg | ORAL_TABLET | Freq: Every day | ORAL | 0 refills | Status: DC
Start: 2024-02-10 — End: 2024-05-10
  Filled 2024-02-10: qty 90, 90d supply, fill #0

## 2024-02-10 NOTE — Telephone Encounter (Signed)
*  STAT* If patient is at the pharmacy, call can be transferred to refill team.   1. Which medications need to be refilled? (please list name of each medication and dose if known)   atorvastatin  (LIPITOR) 80 MG tablet    2. Which pharmacy/location (including street and city if local pharmacy) is medication to be sent to?  [facility] [address] [place], [place] - [address]      3. Do they need a 30 day or 90 day supply? 90 day

## 2024-02-14 ENCOUNTER — Other Ambulatory Visit (HOSPITAL_COMMUNITY): Payer: Self-pay

## 2024-02-25 ENCOUNTER — Other Ambulatory Visit: Payer: Self-pay | Admitting: Cardiology

## 2024-02-25 DIAGNOSIS — I480 Paroxysmal atrial fibrillation: Secondary | ICD-10-CM

## 2024-02-25 NOTE — Telephone Encounter (Signed)
 Prescription refill request for Eliquis  received. Indication:afib Last office visit:1/25 Scr:0.84  4/25 Age: 68 Weight:94.6  kg  Prescription refilled

## 2024-04-23 ENCOUNTER — Encounter: Payer: Self-pay | Admitting: Cardiology

## 2024-04-25 ENCOUNTER — Other Ambulatory Visit: Payer: Self-pay | Admitting: *Deleted

## 2024-04-25 DIAGNOSIS — I7122 Aneurysm of the aortic arch, without rupture: Secondary | ICD-10-CM

## 2024-05-10 ENCOUNTER — Other Ambulatory Visit: Payer: Self-pay | Admitting: Cardiology

## 2024-05-10 DIAGNOSIS — E785 Hyperlipidemia, unspecified: Secondary | ICD-10-CM

## 2024-05-18 ENCOUNTER — Encounter: Payer: Self-pay | Admitting: *Deleted

## 2024-05-25 ENCOUNTER — Encounter: Payer: Self-pay | Admitting: *Deleted

## 2024-05-30 ENCOUNTER — Ambulatory Visit (HOSPITAL_COMMUNITY)
Admission: RE | Admit: 2024-05-30 | Discharge: 2024-05-30 | Disposition: A | Discharge status: Home / Self Care | Payer: Self-pay | Source: Ambulatory Visit | Attending: Cardiology | Admitting: Cardiology

## 2024-05-30 DIAGNOSIS — I7122 Aneurysm of the aortic arch, without rupture: Secondary | ICD-10-CM | POA: Diagnosis present

## 2024-05-30 MED ORDER — IOHEXOL 350 MG/ML SOLN
75.0000 mL | Freq: Once | INTRAVENOUS | Status: AC | PRN
  Administered 2024-05-30: 75 mL via INTRAVENOUS

## 2024-06-04 ENCOUNTER — Ambulatory Visit: Payer: Self-pay | Admitting: Cardiology

## 2024-07-07 ENCOUNTER — Other Ambulatory Visit: Payer: Self-pay | Admitting: Cardiology

## 2024-07-07 DIAGNOSIS — E785 Hyperlipidemia, unspecified: Secondary | ICD-10-CM

## 2024-08-07 ENCOUNTER — Ambulatory Visit: Admitting: Cardiology

## 2024-08-09 ENCOUNTER — Other Ambulatory Visit: Payer: Self-pay | Admitting: Cardiology

## 2024-08-09 DIAGNOSIS — E785 Hyperlipidemia, unspecified: Secondary | ICD-10-CM

## 2024-09-14 ENCOUNTER — Ambulatory Visit: Admitting: Cardiology
